# Patient Record
Sex: Female | Born: 1945
Health system: Southern US, Community
[De-identification: ages and names within clinical notes are randomized; demographics above are authoritative.]

## PROBLEM LIST (undated history)

## (undated) DIAGNOSIS — Z9889 Other specified postprocedural states: Secondary | ICD-10-CM

## (undated) DIAGNOSIS — K219 Gastro-esophageal reflux disease without esophagitis: Secondary | ICD-10-CM

## (undated) DIAGNOSIS — M1990 Unspecified osteoarthritis, unspecified site: Secondary | ICD-10-CM

## (undated) DIAGNOSIS — R7303 Prediabetes: Secondary | ICD-10-CM

## (undated) DIAGNOSIS — E7800 Pure hypercholesterolemia, unspecified: Secondary | ICD-10-CM

## (undated) DIAGNOSIS — R112 Nausea with vomiting, unspecified: Secondary | ICD-10-CM

## (undated) DIAGNOSIS — E039 Hypothyroidism, unspecified: Secondary | ICD-10-CM

## (undated) DIAGNOSIS — I10 Essential (primary) hypertension: Secondary | ICD-10-CM

## (undated) HISTORY — PX: TOTAL KNEE ARTHROPLASTY: SHX125

## (undated) HISTORY — PX: CHOLECYSTECTOMY: SHX55

## (undated) HISTORY — PX: URETER SURGERY: SHX823

## (undated) HISTORY — PX: ABDOMINAL HYSTERECTOMY: SHX81

## (undated) HISTORY — PX: DIAGNOSTIC LAPAROSCOPY: SUR761

---

## 1997-05-07 ENCOUNTER — Ambulatory Visit (HOSPITAL_COMMUNITY): Admission: RE | Admit: 1997-05-07 | Discharge: 1997-05-07 | Payer: Self-pay | Admitting: Family Medicine

## 1998-05-20 ENCOUNTER — Encounter: Payer: Self-pay | Admitting: Family Medicine

## 1998-05-20 ENCOUNTER — Ambulatory Visit (HOSPITAL_COMMUNITY): Admission: RE | Admit: 1998-05-20 | Discharge: 1998-05-20 | Payer: Self-pay | Admitting: Family Medicine

## 1999-05-23 ENCOUNTER — Ambulatory Visit (HOSPITAL_COMMUNITY): Admission: RE | Admit: 1999-05-23 | Discharge: 1999-05-23 | Payer: Self-pay | Admitting: Family Medicine

## 1999-05-23 ENCOUNTER — Encounter: Payer: Self-pay | Admitting: Family Medicine

## 2000-05-24 ENCOUNTER — Ambulatory Visit (HOSPITAL_COMMUNITY): Admission: RE | Admit: 2000-05-24 | Discharge: 2000-05-24 | Payer: Self-pay | Admitting: Family Medicine

## 2000-05-24 ENCOUNTER — Encounter: Payer: Self-pay | Admitting: Family Medicine

## 2001-05-29 ENCOUNTER — Ambulatory Visit (HOSPITAL_COMMUNITY): Admission: RE | Admit: 2001-05-29 | Discharge: 2001-05-29 | Payer: Self-pay | Admitting: Family Medicine

## 2001-05-29 ENCOUNTER — Encounter: Payer: Self-pay | Admitting: Family Medicine

## 2002-06-18 ENCOUNTER — Encounter: Payer: Self-pay | Admitting: Family Medicine

## 2002-06-18 ENCOUNTER — Ambulatory Visit (HOSPITAL_COMMUNITY): Admission: RE | Admit: 2002-06-18 | Discharge: 2002-06-18 | Payer: Self-pay | Admitting: Family Medicine

## 2002-06-20 ENCOUNTER — Encounter: Payer: Self-pay | Admitting: Family Medicine

## 2002-06-20 ENCOUNTER — Encounter: Admission: RE | Admit: 2002-06-20 | Discharge: 2002-06-20 | Payer: Self-pay | Admitting: Family Medicine

## 2003-06-25 ENCOUNTER — Ambulatory Visit (HOSPITAL_COMMUNITY): Admission: RE | Admit: 2003-06-25 | Discharge: 2003-06-25 | Payer: Self-pay | Admitting: Family Medicine

## 2004-06-28 ENCOUNTER — Ambulatory Visit (HOSPITAL_COMMUNITY): Admission: RE | Admit: 2004-06-28 | Discharge: 2004-06-28 | Payer: Self-pay | Admitting: Family Medicine

## 2004-11-15 ENCOUNTER — Encounter: Admission: RE | Admit: 2004-11-15 | Discharge: 2004-11-15 | Payer: Self-pay | Admitting: Family Medicine

## 2005-07-07 ENCOUNTER — Ambulatory Visit (HOSPITAL_COMMUNITY): Admission: RE | Admit: 2005-07-07 | Discharge: 2005-07-07 | Payer: Self-pay | Admitting: Family Medicine

## 2005-09-28 ENCOUNTER — Other Ambulatory Visit: Admission: RE | Admit: 2005-09-28 | Discharge: 2005-09-28 | Payer: Self-pay | Admitting: Family Medicine

## 2006-08-10 ENCOUNTER — Ambulatory Visit (HOSPITAL_COMMUNITY): Admission: RE | Admit: 2006-08-10 | Discharge: 2006-08-10 | Payer: Self-pay | Admitting: Family Medicine

## 2007-08-13 ENCOUNTER — Ambulatory Visit (HOSPITAL_COMMUNITY): Admission: RE | Admit: 2007-08-13 | Discharge: 2007-08-13 | Payer: Self-pay | Admitting: Family Medicine

## 2007-08-22 ENCOUNTER — Inpatient Hospital Stay (HOSPITAL_COMMUNITY): Admission: RE | Admit: 2007-08-22 | Discharge: 2007-08-25 | Payer: Self-pay | Admitting: Orthopedic Surgery

## 2008-08-14 ENCOUNTER — Ambulatory Visit (HOSPITAL_COMMUNITY): Admission: RE | Admit: 2008-08-14 | Discharge: 2008-08-14 | Payer: Self-pay | Admitting: Family Medicine

## 2008-10-08 ENCOUNTER — Other Ambulatory Visit: Admission: RE | Admit: 2008-10-08 | Discharge: 2008-10-08 | Payer: Self-pay | Admitting: Family Medicine

## 2009-08-16 ENCOUNTER — Encounter: Admission: RE | Admit: 2009-08-16 | Discharge: 2009-08-16 | Payer: Self-pay | Admitting: Family Medicine

## 2010-07-12 NOTE — Op Note (Signed)
Krystal Kent, Krystal Kent                 ACCOUNT NO.:  [id number]   MEDICAL RECORD NO.:  [id number]          PATIENT TYPE:  INP   LOCATION:  5011                         FACILITY:  [facility]   PHYSICIAN:  Vania Rea. Supple, M.D.  DATE OF BIRTH:  06-19-1945   DATE OF PROCEDURE:  08/22/2007  DATE OF DISCHARGE:                               OPERATIVE REPORT   PREOPERATIVE DIAGNOSIS:  End-stage left knee osteoarthrosis.   POSTOPERATIVE DIAGNOSIS:  End-stage left knee osteoarthrosis.   PROCEDURE:  Left total knee arthroplasty using a cemented DePuy Sigma  implant size 2.5 femur, size 3 tibial tray, 35-mm patellar button, and a  15-mm thick rotating platform polyethylene insert.   SURGEON:  Vania Rea. Supple, MD   ASSISTANT:  Lucita Lora. Shuford, PA-C   ANESTHESIA:  General endotracheal as well as a preop femoral nerve  block.   TOURNIQUET TIME:  44 minutes.   ESTIMATED BLOOD LOSS:  Minimal.   DRAINS:  Hemovac x1.   HISTORY:  Krystal Kent is a 65 year old female who has had chronic and  progressively increasing bilateral knee pain with clinical and  radiographic evidence for end-stage osteoarthrosis.  Due to her ongoing  pain and function limitation, she is brought to the operating room at  this time for planned left total knee arthroplasty.   Preoperatively counseled Krystal Kent on treatment options as well as risks  versus benefits.  Possible surgical complications bleeding, infection,  neurovascular injury, DVT, PE, persistent pain, loss of motion, and  possible need for revision surgery reviewed.  She understands and  accepts and agrees with our planned procedure.   PROCEDURE IN DETAIL:  After undergoing routine preop evaluation, the  patient received prophylactic antibiotics.  Femoral nerve block  established in the holding area by the anesthesia department.  Placed  supine on the operative table and underwent smooth induction of general  endotracheal anesthesia.  Foley catheter was placed.   Left thigh and  left leg was sterilely prepped and draped in standard fashion.  Time-out  called.  Leg was exsanguinated.  Tourniquet inflated to 350 mmHg.  Anterior midline incision was made approximately 20 cm in length  centered over the patella.  Skin flaps were mobilized.  Medial  parapatellar patellar arthrotomy was performed.  Patella was everted,  knee flexed up, and the cruciate ligaments were divided.  Fat pad was  excised and drill was then used to gain access to the intramedullary  canal of the distal femur and intramedullary guide was then passed.  We  made a 11-mm resection off the distal, femur 5 degrees valgus cut with  an oscillating saw.  We then measured the distal femur and then size 2.5  had the best fit.  The 2.5 femoral cutting guide was then applied.  We  made the anterior, posterior, and chamfer cuts on the distal femur.  This showed excellent fit with the trial prosthesis.  We then turned our  attention to the proximal tibia.  The remnants of the menisci and  cruciate ligaments were then removed.  The proximal tibia was exposed  and  using an extramedullary guide, we then made a 10-mm resection  measured from the lateral tibial plateau with proper posterior slope.  Proximal tibia was then measured and sized.  It had best coverage.  The  size 3 trial was pinned into position and then we performed a trial  reduction with the implants and this showed excellent soft tissue  balance, good knee stability, and mobility.  We then completed terminal  preparation of the proximal tibia with the reamer and broach.  Attention  was turned to the distal femur where we made the distal femoral box cut  using the box cutting guide.  We removed residual soft tissue from the  posterior aspect of the joint.  Attention was then turned to the  patella, which was cut with an oscillating saw removing approximately 8  mm of bone and then the stabilizing lug holes were drilled.  We   performed trial implant reductions with a box guide and again there was  full knee of motion, full extension, and good soft tissue balance.  Final implants were removed.  The joint was then pulsatilely lavaged and  meticulously cleaned.  Cement was mixed at the back table and at the  appropriate consistency all implants were cemented in position beginning  with the tibia, then the femur, then the patella.  All residual cement  was then meticulously removed.  Once the cement had hardened, we then  performed trial reductions and the size 15 tibial implant had best soft  tissue balance.  The final 50-mm poly was inserted after we meticulously  inspected the joint.  There was excellent knee motion with good  stability.  A Hemovac was then brought out superolaterally.  The  tourniquet was let down.  Hemostasis was obtained.  The parapatellar  arthrotomy was closed with series of figure-of-eight #1 Vicryl sutures,  2-0 Vicryl were used for the subcu, and intracuticular 3-0 Monocryl for  the skin followed by Steri-Strips.  A bulky dry dressing was then  wrapped around the knee and legs were wrapped with an Ace bandage from  foot to thigh.  It should be mentioned that she did get an additional  gram of Ancef when the tourniquet was let down.  The patient was then  awakened, extubated, and taken to the recovery room in stable condition.      Vania Rea. Supple, M.D.  Electronically Signed     KMS/MEDQ  D:  08/22/2007  T:  08/23/2007  Job:  [id number]

## 2010-07-15 NOTE — Discharge Summary (Signed)
Krystal Kent, Krystal Kent                 ACCOUNT NO.:  [id number]   MEDICAL RECORD NO.:  [id number]          PATIENT TYPE:  INP   LOCATION:  5011                         FACILITY:  [facility]   PHYSICIAN:  Vania Rea. Supple, M.D.  DATE OF BIRTH:  January 31, 1946   DATE OF ADMISSION:  08/22/2007  DATE OF DISCHARGE:  08/25/2007                               DISCHARGE SUMMARY   ADMISSION DIAGNOSES:  1. End-stage osteoarthrosis of left knee.  2. Hypertension.  3. Hyperthyroidism.  4. History of postoperative nausea and vomiting.   DISCHARGE DIAGNOSES:  1. End-stage osteoarthrosis of left knee.  2. Hypertension.  3. Hyperthyroidism.  4. History of postoperative nausea and vomiting.  5. Status post left total knee arthroplasty.   OPERATION:  Left total knee arthroplasty under general anesthetic with a  femoral nerve block.   SURGEON:  Vania Rea. Supple, MD   ASSISTANT:  Lucita Lora. Shuford, PA-C   BRIEF HISTORY:  [name] is a 65 year old female who is followed by [place]  for end-stage osteoarthrosis to bilateral knees.  Her left is currently  most symptomatic.  She is having ongoing pain as well as functional  limitations and has had failure to improve with conservative measures.  At this time, total knee arthroplasty was indicated.  Risks and benefits  were discussed with the patient and she wished to proceed.   HOSPITAL COURSE:  The patient admitted, underwent the above-named  procedure, and tolerated this well.  All appropriate IV antibiotics and  analgesics were utilized.  Postoperatively, she was placed on DVT and PE  prophylaxis, and weightbearing as tolerated with CPM unit and formal  physical therapy.  She did extremely well, was able to be weaned off IV  medications to p.o.'s.  Physical therapy continued work with the  patient, and she had met all of her discharge goals by postop day #3.  On day #3, she was afebrile.  Vital signs were stable.  Hemoglobin was  stable at 9.6.  Her incision was  clean and dry, and discharge plans were  made to discharge to home, to follow up as an outpatient, and for home  health OT and PT.   EKG with normal sinus rhythm.   LABORATORY DATA:  Hemoglobin of 14.6 preoperatively and postoperatively  11.2, 9.8, and 9.6.  Chemistries on admission within normal limits.  She  may have mild hypokalemia of 3.3, but resolved to 3.8 on date of  discharge.  No x-rays were found in the chart at the time of dictation.   CONDITION ON DISCHARGE:  Stable, improved.   DISCHARGE MEDICATIONS AND PLANS:  The patient had been discharged to  home.  She will follow up in 2 weeks, call beforetime.  May shower 5  days postoperatively.  Prescriptions for Percocet, Coumadin, Ambien, and  Robaxin are provided.  [facility] and OT is arranged.  Resume her home meds and home diet.      Tracy A. Shuford, P.A.-C.       Vania Rea. Supple, M.D.  Electronically Signed    TAS/MEDQ  D:  09/26/2007  T:  09/27/2007  Job:  [id number]

## 2010-07-19 ENCOUNTER — Other Ambulatory Visit: Payer: Self-pay | Admitting: Family Medicine

## 2010-07-19 DIAGNOSIS — Z1231 Encounter for screening mammogram for malignant neoplasm of breast: Secondary | ICD-10-CM

## 2010-08-24 ENCOUNTER — Ambulatory Visit
Admission: RE | Admit: 2010-08-24 | Discharge: 2010-08-24 | Disposition: A | Discharge status: Home / Self Care | Payer: Commercial Indemnity | Source: Ambulatory Visit | Attending: Family Medicine | Admitting: Family Medicine

## 2010-08-24 DIAGNOSIS — Z1231 Encounter for screening mammogram for malignant neoplasm of breast: Secondary | ICD-10-CM

## 2010-11-24 LAB — CBC
HCT: 27.8 — ABNORMAL LOW
HCT: 27.9 — ABNORMAL LOW
HCT: 32.3 — ABNORMAL LOW
HCT: 41.3
Hemoglobin: 14.6
Hemoglobin: 9.8 — ABNORMAL LOW
MCHC: 35.3
Platelets: 133 — ABNORMAL LOW
Platelets: 167
Platelets: 228
RBC: 3 — ABNORMAL LOW
RBC: 4.45
RDW: 12.6
WBC: 4.6
WBC: 7.3
WBC: 7.8
WBC: 9.6

## 2010-11-24 LAB — BASIC METABOLIC PANEL
BUN: 10
CO2: 27
CO2: 28
CO2: 29
CO2: 29
Calcium: 10
Calcium: 8.2 — ABNORMAL LOW
Chloride: 100
Chloride: 103
Chloride: 107
Creatinine, Ser: 0.73
GFR calc Af Amer: >60
GFR calc Af Amer: >60
GFR calc non Af Amer: >60
GFR calc non Af Amer: >60
GFR calc non Af Amer: >60
Glucose, Bld: 184 — ABNORMAL HIGH
Potassium: 3.3 — ABNORMAL LOW
Potassium: 3.8
Sodium: 139

## 2010-11-24 LAB — PROTIME-INR
INR: 0.9
Prothrombin Time: 12.8

## 2011-07-17 ENCOUNTER — Other Ambulatory Visit: Payer: Self-pay | Admitting: Family Medicine

## 2011-07-17 DIAGNOSIS — Z1231 Encounter for screening mammogram for malignant neoplasm of breast: Secondary | ICD-10-CM

## 2011-09-05 ENCOUNTER — Ambulatory Visit
Admission: RE | Admit: 2011-09-05 | Discharge: 2011-09-05 | Disposition: A | Discharge status: Home / Self Care | Payer: Medicare Other | Source: Ambulatory Visit | Attending: Family Medicine | Admitting: Family Medicine

## 2011-09-05 DIAGNOSIS — Z1231 Encounter for screening mammogram for malignant neoplasm of breast: Secondary | ICD-10-CM

## 2012-08-05 ENCOUNTER — Other Ambulatory Visit: Payer: Self-pay

## 2012-08-05 DIAGNOSIS — Z1231 Encounter for screening mammogram for malignant neoplasm of breast: Secondary | ICD-10-CM

## 2012-09-03 ENCOUNTER — Encounter (HOSPITAL_COMMUNITY): Payer: Self-pay | Admitting: Emergency Medicine

## 2012-09-03 ENCOUNTER — Emergency Department (HOSPITAL_COMMUNITY)
Admission: EM | Admit: 2012-09-03 | Discharge: 2012-09-03 | Disposition: A | Discharge status: Home / Self Care | Payer: Medicare Other | Attending: Emergency Medicine | Admitting: Emergency Medicine

## 2012-09-03 ENCOUNTER — Emergency Department (HOSPITAL_COMMUNITY): Payer: Medicare Other

## 2012-09-03 DIAGNOSIS — E785 Hyperlipidemia, unspecified: Secondary | ICD-10-CM

## 2012-09-03 DIAGNOSIS — Z7982 Long term (current) use of aspirin: Secondary | ICD-10-CM | POA: Insufficient documentation

## 2012-09-03 DIAGNOSIS — Z79899 Other long term (current) drug therapy: Secondary | ICD-10-CM | POA: Insufficient documentation

## 2012-09-03 DIAGNOSIS — R0789 Other chest pain: Secondary | ICD-10-CM | POA: Insufficient documentation

## 2012-09-03 DIAGNOSIS — R079 Chest pain, unspecified: Secondary | ICD-10-CM

## 2012-09-03 DIAGNOSIS — E7800 Pure hypercholesterolemia, unspecified: Secondary | ICD-10-CM | POA: Insufficient documentation

## 2012-09-03 DIAGNOSIS — I10 Essential (primary) hypertension: Secondary | ICD-10-CM | POA: Insufficient documentation

## 2012-09-03 HISTORY — DX: Pure hypercholesterolemia, unspecified: E78.00

## 2012-09-03 HISTORY — DX: Essential (primary) hypertension: I10

## 2012-09-03 LAB — URINALYSIS, ROUTINE W REFLEX MICROSCOPIC
Bilirubin Urine: NEGATIVE
Hgb urine dipstick: NEGATIVE
Ketones, ur: NEGATIVE mg/dL
Nitrite: NEGATIVE
Protein, ur: NEGATIVE mg/dL
Urobilinogen, UA: 0.2 mg/dL (ref 0.0–1.0)

## 2012-09-03 LAB — CBC
HCT: 39.1 % (ref 36.0–46.0)
Hemoglobin: 13.3 g/dL (ref 12.0–15.0)
MCH: 30.7 pg (ref 26.0–34.0)
MCHC: 34 g/dL (ref 30.0–36.0)
RDW: 13 % (ref 11.5–15.5)

## 2012-09-03 LAB — HEPATIC FUNCTION PANEL
ALT: 26 U/L (ref 0–35)
AST: 26 U/L (ref 0–37)
Alkaline Phosphatase: 44 U/L (ref 39–117)
Bilirubin, Direct: <0.1 mg/dL (ref 0.0–0.3)
Total Bilirubin: 0.5 mg/dL (ref 0.3–1.2)

## 2012-09-03 LAB — BASIC METABOLIC PANEL
BUN: 25 mg/dL — ABNORMAL HIGH (ref 6–23)
Calcium: 9.9 mg/dL (ref 8.4–10.5)
Creatinine, Ser: 0.98 mg/dL (ref 0.50–1.10)
GFR calc Af Amer: 68 mL/min — ABNORMAL LOW (ref >90)
GFR calc non Af Amer: 59 mL/min — ABNORMAL LOW (ref >90)
Glucose, Bld: 141 mg/dL — ABNORMAL HIGH (ref 70–99)
Potassium: 3.6 mEq/L (ref 3.5–5.1)

## 2012-09-03 LAB — POCT I-STAT TROPONIN I

## 2012-09-03 MED ORDER — IOHEXOL 350 MG/ML SOLN
100.0000 mL | Freq: Once | INTRAVENOUS | Status: AC | PRN
  Administered 2012-09-03: 100 mL via INTRAVENOUS

## 2012-09-03 MED ORDER — NAPROXEN 375 MG PO TABS: 375.0000 mg | ORAL_TABLET | Freq: Two times a day (BID) | ORAL | Status: DC

## 2012-09-03 NOTE — ED Notes (Signed)
IV team paged to start 20 Gauge for angio chest CT.

## 2012-09-03 NOTE — ED Provider Notes (Signed)
History    CSN: [id number] Arrival date & time 09/03/12  1626  First MD Initiated Contact with Patient 09/03/12 1629     Chief Complaint  Patient presents with  . Chest Pain   (Consider location/radiation/quality/duration/timing/severity/associated sxs/prior Treatment) The history is provided by the patient and the spouse.  Krystal Kent is a 67 y/o F with PMHx of HLD, HTN presenting to the ED, with husband, with left sided chest pain that started at approximately 2:30PM this afternoon and has been ongoing throughout most of the day, patient is unable to described what the pain feels like, but stated that there is pain localized to the left side of the chest, underneath her left breast without radiation to the left arm or back. Patient reported that the pain worsens with deep inhalation, reported that she took a Motrin at 3:00PM and two ASA 81 mg, and one tums. Stated that she was exercising on her bike for her knee and was vacuuming today before the pain started. Patient reported that she was experiencing left arm discomfort approximately 3-5 days ago, unable to described the discomfort, stated that she was unable to get comfortable. Patient concerned of blood clot because she stated that she went to [place] during Father's day weekend, was in the car for a long time and someone told her that if could possibly be a clot. Patient stated that she had a stress test approximately 10-12 years ago. Denied numbness, tingling, fall, injury, history of cardiac issues, difficulty breathing, dyspnea, orthopnea, headache, dizziness, neck pain, back pain, weakness, fatigue. PCP: Dr. Ihor Dow   Past Medical History  Diagnosis Date  . Hypertension   . Hypercholesteremia    Past Surgical History  Procedure Laterality Date  . Abdominal hysterectomy    . Cholecystectomy    . Total knee arthroplasty     No family history on file. History  Substance Use Topics  . Smoking status: Never Smoker   . Smokeless  tobacco: Not on file  . Alcohol Use: No   OB History   Grav Para Term Preterm Abortions TAB SAB Ect Mult Living                 Review of Systems  Constitutional: Negative for fever and chills.  HENT: Negative for trouble swallowing and neck pain.   Eyes: Negative for pain and visual disturbance.  Respiratory: Negative for cough, chest tightness and shortness of breath.   Cardiovascular: Positive for chest pain. Negative for leg swelling.  Gastrointestinal: Negative for nausea, vomiting and diarrhea.  Genitourinary: Negative for dysuria, decreased urine volume and difficulty urinating.  Musculoskeletal: Negative for back pain.  Neurological: Negative for dizziness, weakness, light-headedness and headaches.  All other systems reviewed and are negative.    Allergies  Demerol  Home Medications   Current Outpatient Rx  Name  Route  Sig  Dispense  Refill  . amLODipine (NORVASC) 10 MG tablet   Oral   Take 10 mg by mouth daily.         Marland Kitchen aspirin 81 MG chewable tablet   Oral   Chew 81 mg by mouth daily.         Marland Kitchen atorvastatin (LIPITOR) 20 MG tablet   Oral   Take 20 mg by mouth daily.         . calcium carbonate (OS-CAL) 600 MG TABS   Oral   Take 600 mg by mouth daily with breakfast.         . cholecalciferol (  VITAMIN D) 1000 UNITS tablet   Oral   Take 1,000 Units by mouth daily.         . Coenzyme Q10 (CO Q 10 PO)   Oral   Take 1 capsule by mouth daily.         . fenofibrate (TRICOR) 48 MG tablet   Oral   Take 48 mg by mouth daily.         Marland Kitchen glucosamine-chondroitin 500-400 MG tablet   Oral   Take 1 tablet by mouth daily.         Marland Kitchen levothyroxine (SYNTHROID, LEVOTHROID) 75 MCG tablet   Oral   Take 75 mcg by mouth daily before breakfast.         . Multiple Vitamins-Minerals (MULTIVITAMIN WITH MINERALS) tablet   Oral   Take 1 tablet by mouth daily.         Marland Kitchen triamterene-hydrochlorothiazide (MAXZIDE-25) 37.5-25 MG per tablet   Oral   Take  1 tablet by mouth daily.          BP 153/61  Pulse 85  Temp(Src) 98.6 F (37 C) (Oral)  Resp 17  Wt 168 lb (76.204 kg)  SpO2 96% Physical Exam  Nursing note and vitals reviewed. Constitutional: She is oriented to person, place, and time. She appears well-developed and well-nourished. No distress.  HENT:  Head: Normocephalic and atraumatic.  Eyes: Conjunctivae and EOM are normal. Pupils are equal, round, and reactive to light.  Neck: Normal range of motion. Neck supple. No tracheal deviation present.  Cardiovascular: Normal rate, regular rhythm and normal heart sounds.  Exam reveals no friction rub.   No murmur heard. Pulses:      Radial pulses are 2+ on the right side, and 2+ on the left side.       Dorsalis pedis pulses are 2+ on the right side, and 2+ on the left side.  Negative ankle and foot swelling noted Negative pitting edema Cap refill < 3 seconds  Pulmonary/Chest: Effort normal and breath sounds normal. No respiratory distress. She has no wheezes. She has no rales.    Pain reproducible with palpation to the left side of the chest underneath breast, approximately left ribs 5-6 mid-clavicular region  When passive pulling of left arm back patient reported feeling a pulling sensation with this motion  Musculoskeletal: Normal range of motion.  Strength 5+/5+ with resistance  Lymphadenopathy:    She has no cervical adenopathy.  Neurological: She is alert and oriented to person, place, and time. She exhibits normal muscle tone. Coordination normal.  Skin: Skin is warm and dry. No rash noted. She is not diaphoretic. No erythema.  Psychiatric: She has a normal mood and affect. Her behavior is normal. Thought content normal.    ED Course  Procedures (including critical care time)  8:00PM Discussed with patient CT angiogram to be performed to rule out blood clot. Denied chest discomfort.    Date: 09/03/2012  Rate: 88  Rhythm: normal sinus rhythm  QRS Axis: normal   Intervals: normal  ST/T Wave abnormalities: normal  Conduction Disutrbances:none  Narrative Interpretation:   Old EKG Reviewed: unchanged   Labs Reviewed  BASIC METABOLIC PANEL - Abnormal; Notable for the following:    Glucose, Bld 141 (*)    BUN 25 (*)    GFR calc non Af Amer 59 (*)    GFR calc Af Amer 68 (*)    All other components within normal limits  CBC  URINALYSIS, ROUTINE W REFLEX MICROSCOPIC  HEPATIC FUNCTION PANEL  POCT I-STAT TROPONIN I  POCT I-STAT TROPONIN I   Dg Chest 2 View  09/03/2012   *RADIOLOGY REPORT*  Clinical Data: Left chest pain  CHEST - 2 VIEW  Comparison: 08/19/2007  Findings: Cardiomediastinal silhouette is stable.  No acute infiltrate or pleural effusion.  No pulmonary edema.  Bony thorax is stable.  IMPRESSION: No active disease.  No significant change.   Original Report Authenticated By: Natasha Mead, M.D.   Ct Angio Chest Pe W/cm &/or Wo Cm  09/03/2012   *RADIOLOGY REPORT*  Clinical Data: Chest pain, possible pulmonary embolus, left-sided pain  CT ANGIOGRAPHY CHEST  Technique:  Multidetector CT imaging of the chest using the standard protocol during bolus administration of intravenous contrast. Multiplanar reconstructed images including MIPs were obtained and reviewed to evaluate the vascular anatomy.  Contrast: OMNIPAQUE IOHEXOL 350 MG/ML SOLN  Comparison: None.  Findings: Images of the thoracic inlet are unremarkable.  No pulmonary embolus is noted.  No mediastinal hematoma or adenopathy. Heart size is within normal limits.  Small hiatal hernia.  No axillary adenopathy.  Visualized upper abdomen is unremarkable.  Images of the lung parenchyma shows no acute infiltrate or pleural effusion.  No pulmonary edema.  Sagittal images of the spine shows osteopenia and mild degenerative changes thoracic spine.  Sagittal view of the sternum is unremarkable.  IMPRESSION:  1.  No pulmonary embolus is noted. 2.  No acute infiltrate or pulmonary edema. 3.  Small hiatal  hernia.   Original Report Authenticated By: Natasha Mead, M.D.   1. Chest pain   2. HTN (hypertension)   3. HLD (hyperlipidemia)     MDM  Patient presenting to the ED with chest pain localized to underneath the left breast without radiation to the left arm that has been ongoing starting today at 2:30PM - patient unable to describe pain. Pain reproducible upon palpation to underneath left breast, near left ribs 5-6 mid-clavicular region. EKG negative for ischemic changes. First and second set of troponins negative. CBC negative findings. BMP elevated BUN noted (25), Cr negative elevation. UA negative findings. Chest xray negative findings. Hepatic function panel negative findings. Dr. Brandon Melnick recommended CT angio to rule out PE - CT angio performed negative PE noted - small hiatal hernia noted, patient aware of this before. Patient reported not having any discomfort - denied chest pain at this moment. Discussed case with Dr. Brandon Melnick - cleared patient for discharge. Patient stable, afebrile. Discharged patient. Referred to PCP and cardiology - recommended patient to get stress test to be performed. Discussed with patient to rest and stay hydrated, monitor chest pain occurences and keep track. Discussed with patient to continue to monitor symptoms and if symptoms are to worsen or change to report back to the ED - strict return instructions given. Patient agreed to plan of care, understood, all questions answered.                     [employer], PA-C 09/04/12 [phone]

## 2012-09-03 NOTE — ED Notes (Signed)
Patient with new onset left sided chest pain under left breast.  No other associated symptoms and no radiation of the pain.  Pain started suddenly and has been constant since 1430 today rated as a 7/10.  Patient took one Tum and two baby aspirin and one Motrin with no relief.  Patient has history of hypertension and high cholesterol.

## 2012-09-03 NOTE — Progress Notes (Signed)
Patient reportds her pcp is Dr. Ihor Dow of [facility] on [address].

## 2012-09-04 NOTE — ED Provider Notes (Signed)
Medical screening examination/treatment/procedure(s) were performed by non-physician practitioner and as supervising physician I was immediately available for consultation/collaboration.  Geoffery Lyons, MD 09/04/12 1800

## 2012-10-01 ENCOUNTER — Ambulatory Visit
Admission: RE | Admit: 2012-10-01 | Discharge: 2012-10-01 | Disposition: A | Discharge status: Home / Self Care | Payer: Medicare Other | Source: Ambulatory Visit

## 2012-10-01 DIAGNOSIS — Z1231 Encounter for screening mammogram for malignant neoplasm of breast: Secondary | ICD-10-CM

## 2012-11-07 ENCOUNTER — Institutional Professional Consult (permissible substitution): Payer: Medicare Other | Admitting: Cardiology

## 2013-09-02 ENCOUNTER — Other Ambulatory Visit: Payer: Self-pay

## 2013-09-02 DIAGNOSIS — Z1231 Encounter for screening mammogram for malignant neoplasm of breast: Secondary | ICD-10-CM

## 2013-10-06 ENCOUNTER — Ambulatory Visit: Payer: Medicare Other

## 2013-10-07 ENCOUNTER — Ambulatory Visit
Admission: RE | Admit: 2013-10-07 | Discharge: 2013-10-07 | Disposition: A | Discharge status: Home / Self Care | Payer: Medicare Other | Source: Ambulatory Visit

## 2013-10-07 DIAGNOSIS — Z1231 Encounter for screening mammogram for malignant neoplasm of breast: Secondary | ICD-10-CM

## 2014-06-01 IMAGING — CT CT ANGIO CHEST
1 of 2 series · 19 of 32 positions shown · IV contrast (OMNIPAQUE 300)
Comparison: None.

CLINICAL DATA: Chest pain, possible pulmonary embolus, left-sided
pain

CT ANGIOGRAPHY CHEST
TECHNIQUE: Multidetector CT imaging of the chest using the
standard protocol during bolus administration of intravenous
contrast. Multiplanar reconstructed images including MIPs were
obtained and reviewed to evaluate the vascular anatomy.
Contrast: 100mL OMNIPAQUE IOHEXOL 350 MG/ML SOLN

[Series 10: thins for pacs · axial · 0.74mm/px · z∈[+22,+204]mm · 19 of 204 slices shown]
[im 11/204  lung]
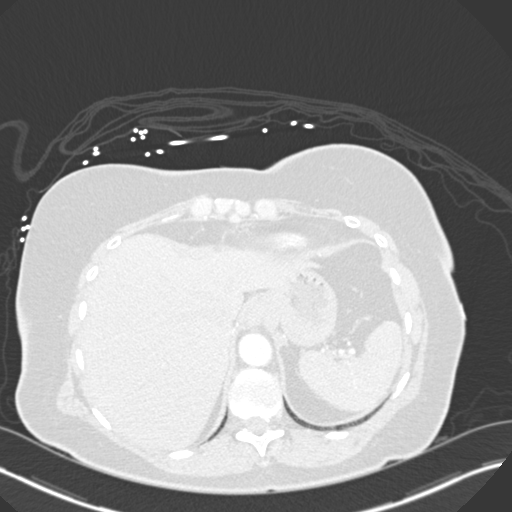
[im 21/204  mediastinal]
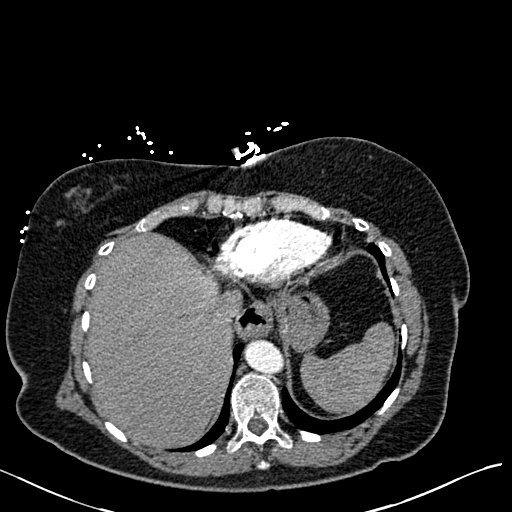
[im 31/204  lung]
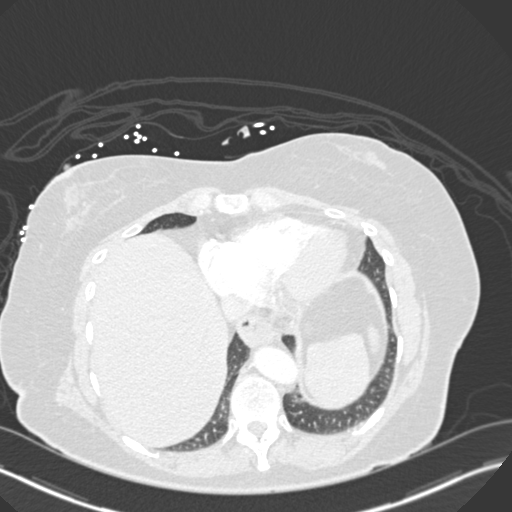
[im 51/204  mediastinal]
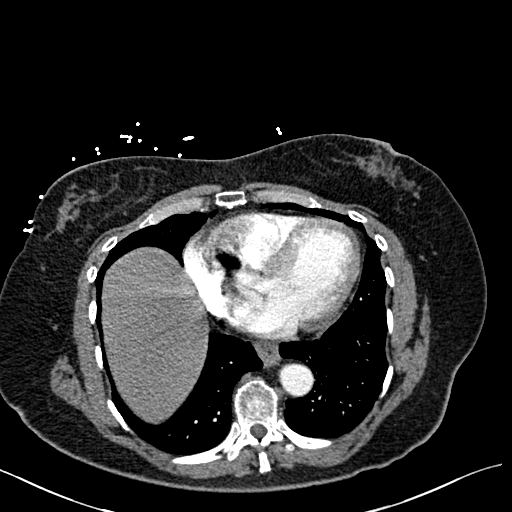
[im 61/204  lung]
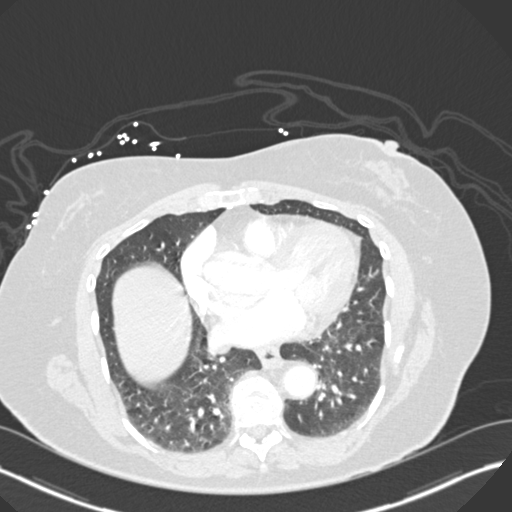
[im 68/204  mediastinal]
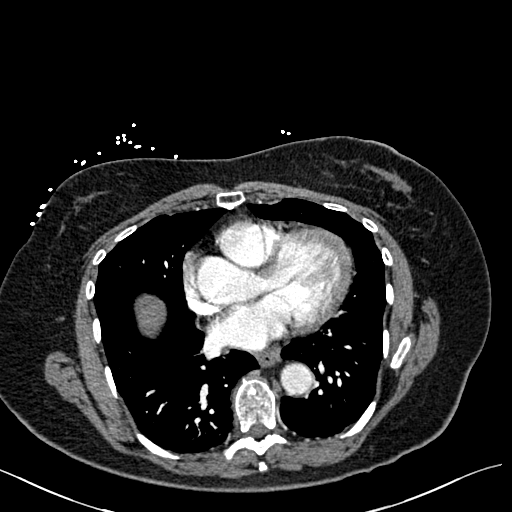
[im 72/204  lung]
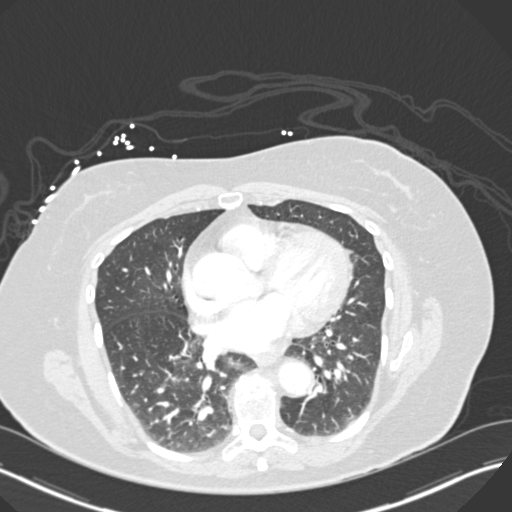
[im 82/204  mediastinal]
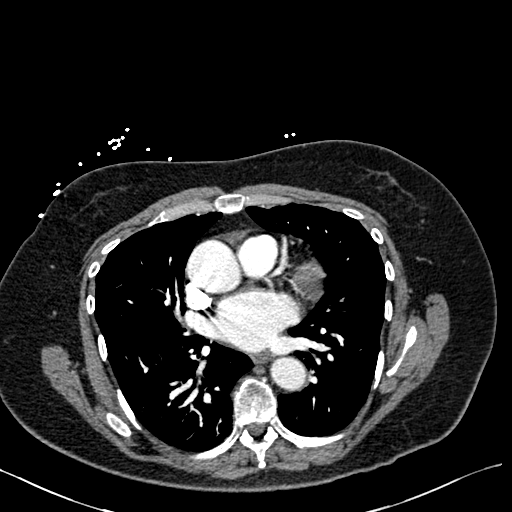
[im 92/204  lung]
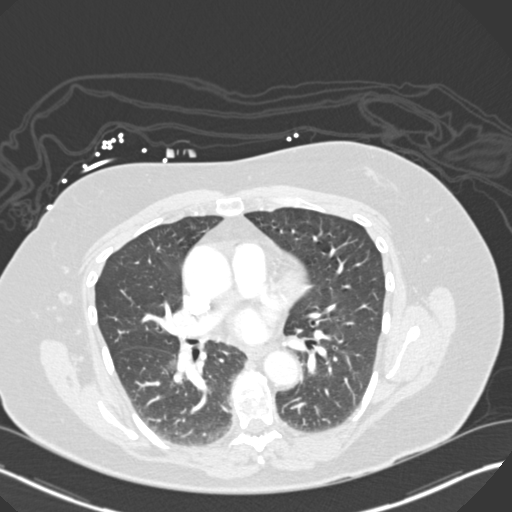
[im 102/204  mediastinal]
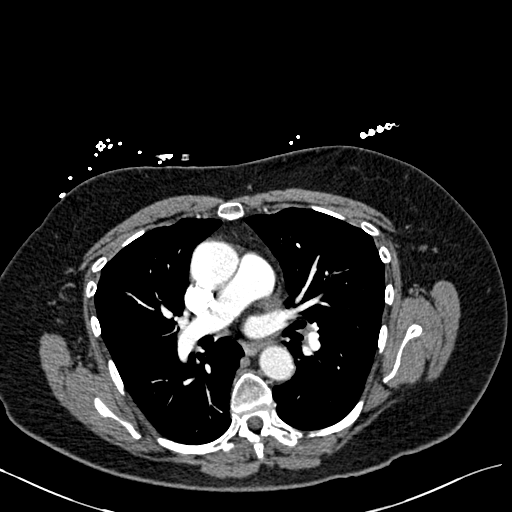
[im 112/204  lung]
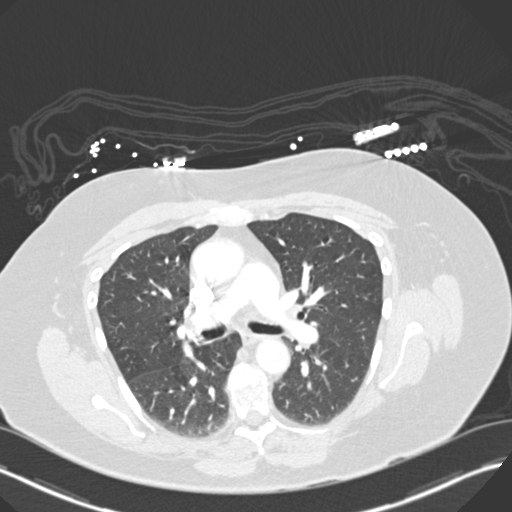
[im 122/204  mediastinal]
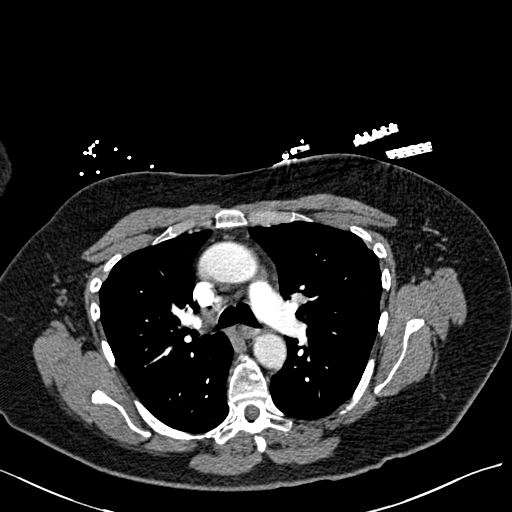
[im 132/204  lung]
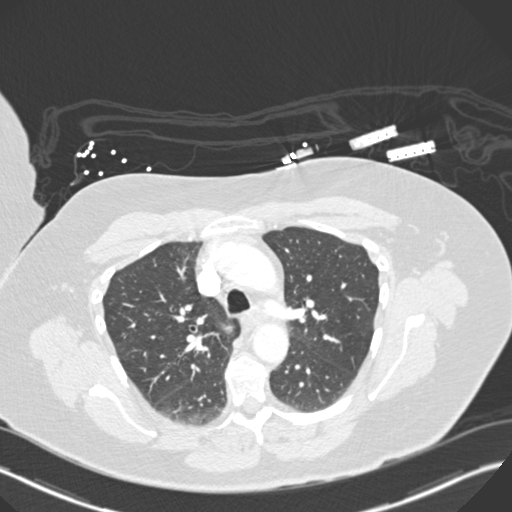
[im 136/204  mediastinal]
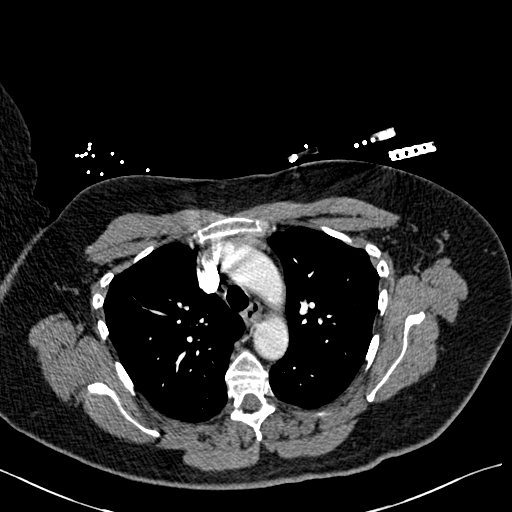
[im 143/204  lung]
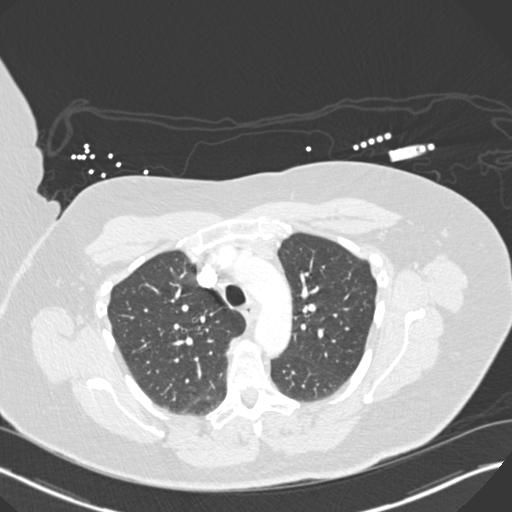
[im 153/204  mediastinal]
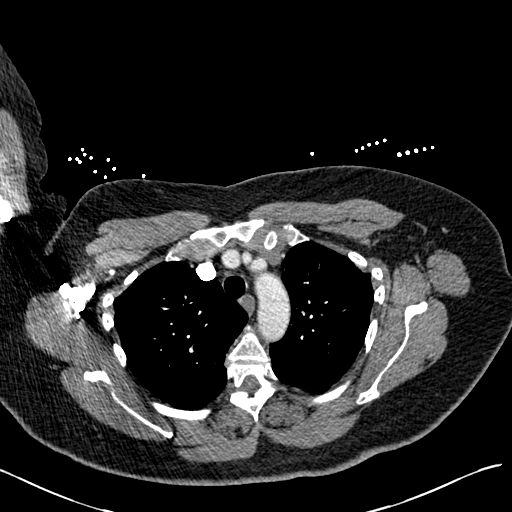
[im 173/204  lung]
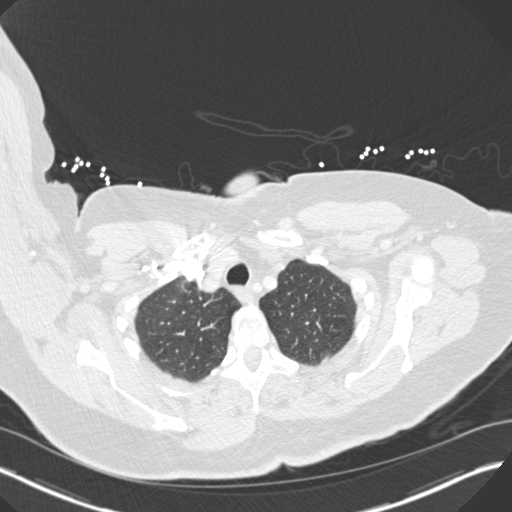
[im 183/204  mediastinal]
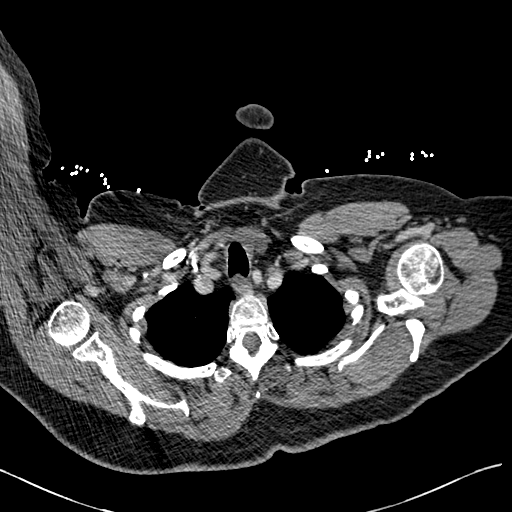
[im 193/204  lung]
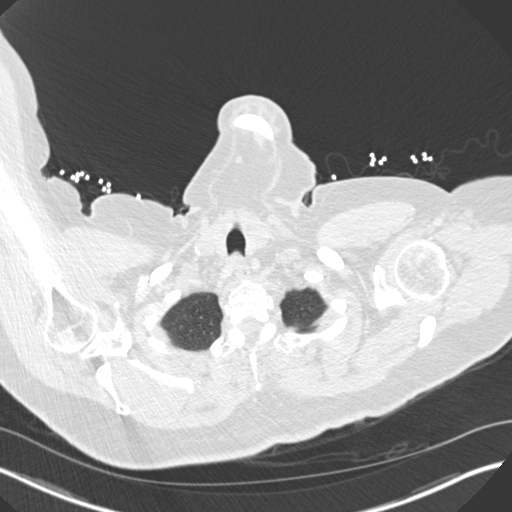

[19 of 32 positions shown; findings below may reference images not displayed]

FINDINGS: Images of the thoracic inlet are unremarkable.  No
pulmonary embolus is noted.  No mediastinal hematoma or adenopathy.
Heart size is within normal limits.  Small hiatal hernia.

No axillary adenopathy.  Visualized upper abdomen is unremarkable.

Images of the lung parenchyma shows no acute infiltrate or pleural
effusion.  No pulmonary edema.

Sagittal images of the spine shows osteopenia and mild degenerative
changes thoracic spine.  Sagittal view of the sternum is
unremarkable.
IMPRESSION: 1.  No pulmonary embolus is noted.
2.  No acute infiltrate or pulmonary edema.
3.  Small hiatal hernia.

## 2014-06-01 IMAGING — CR DG CHEST 2V
2 series · 2 of 2 positions shown · non-contrast
Comparison: 08/19/2007

CLINICAL DATA: Left chest pain

CHEST - 2 VIEW

[w chest pa]
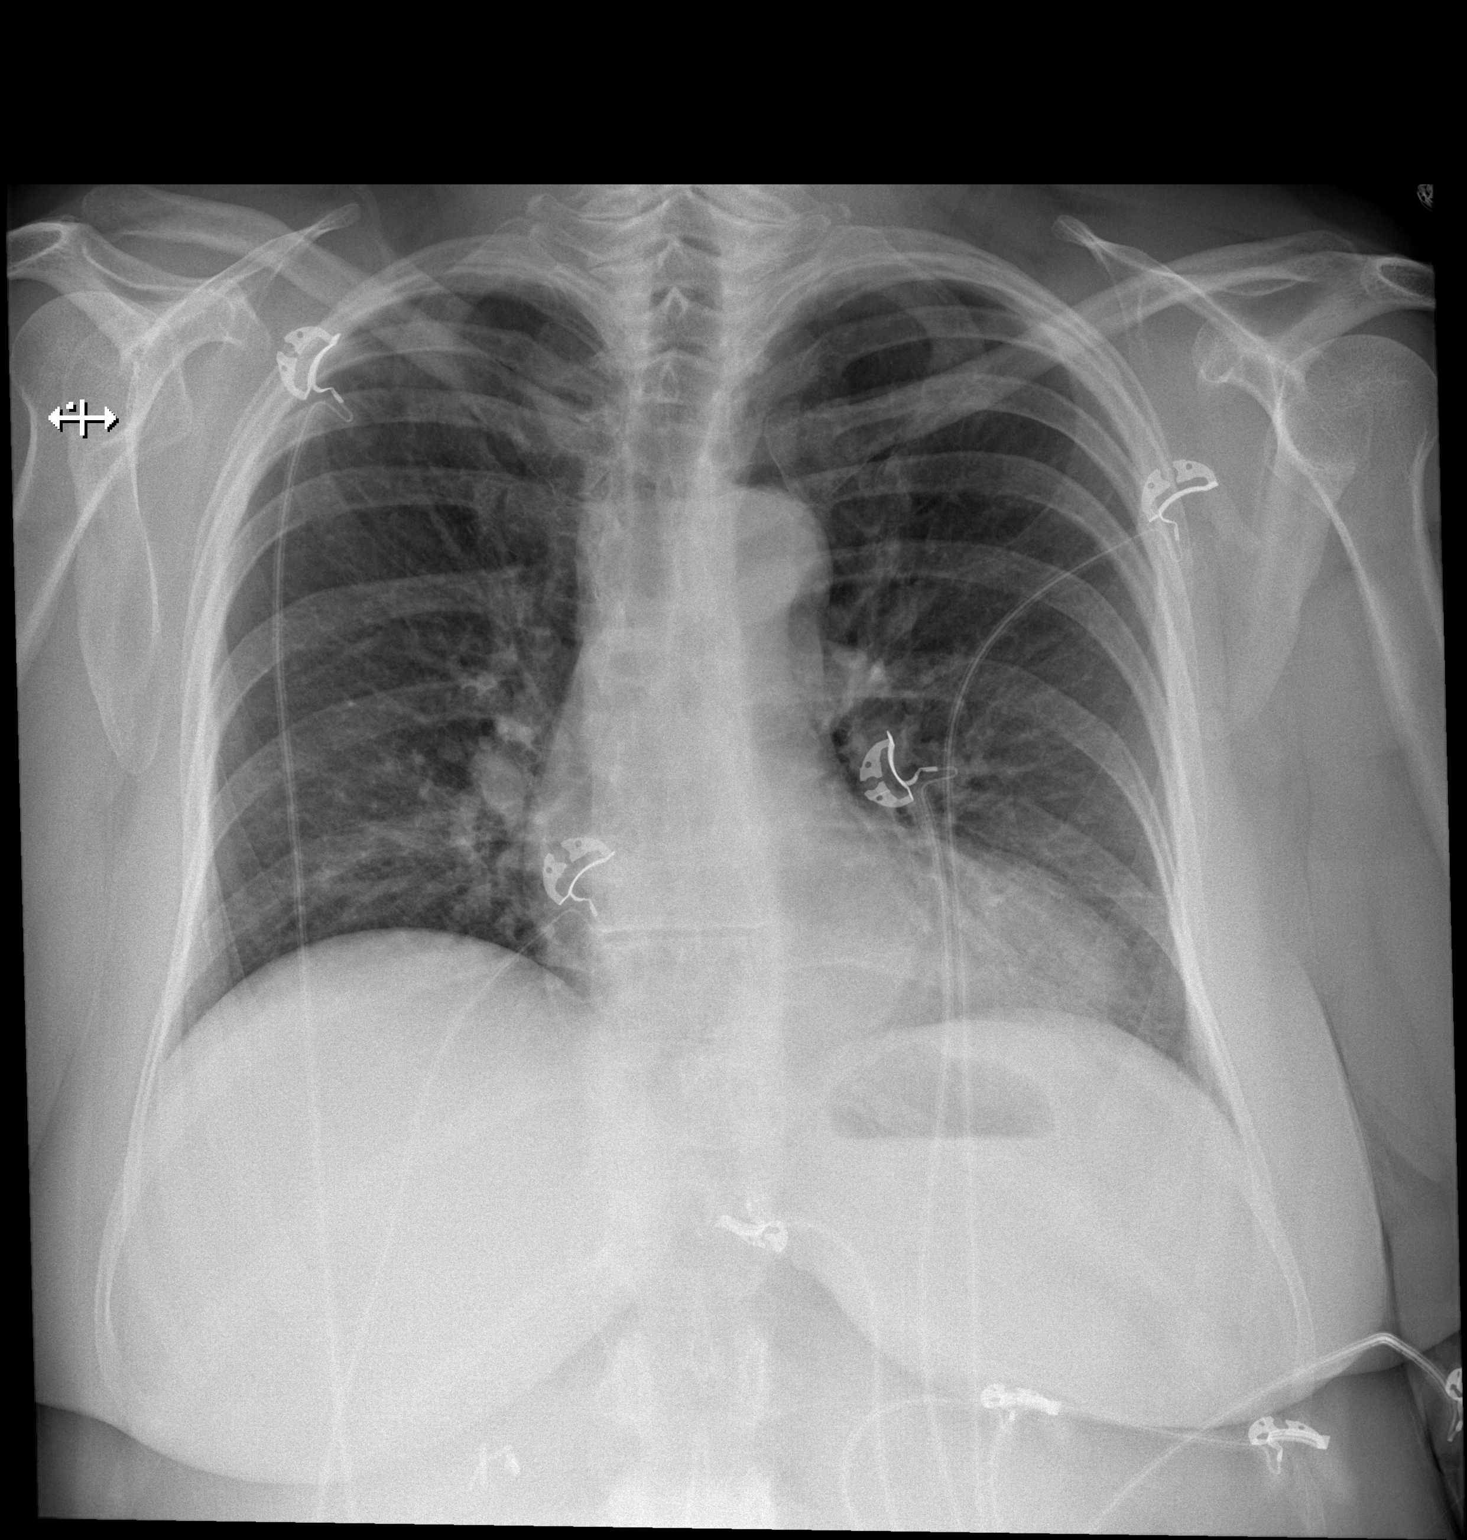

[w chest lat]
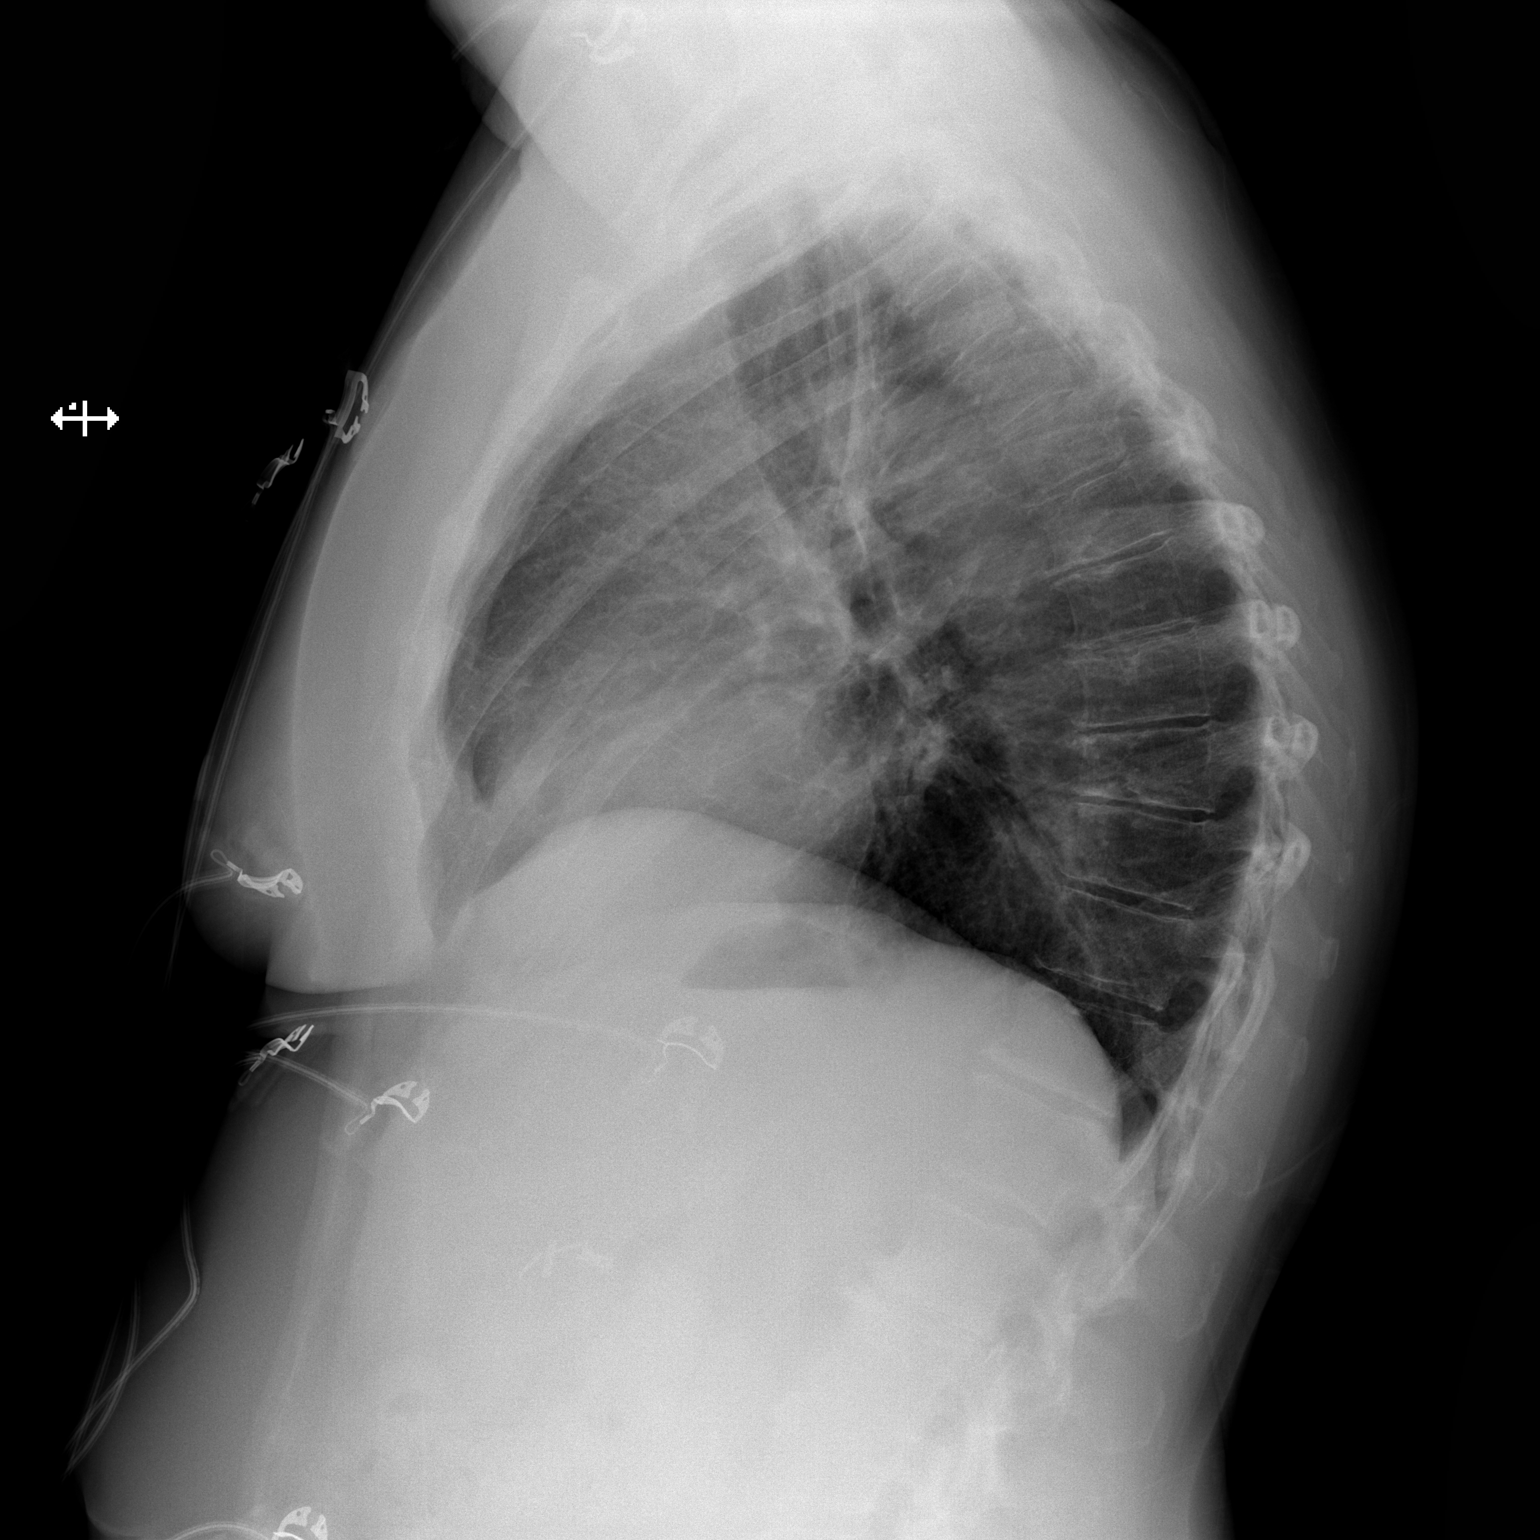

[2 of 2 positions shown; findings below may reference images not displayed]

FINDINGS: Cardiomediastinal silhouette is stable.  No acute
infiltrate or pleural effusion.  No pulmonary edema.  Bony thorax
is stable.
IMPRESSION: No active disease.  No significant change.

## 2014-09-04 ENCOUNTER — Other Ambulatory Visit: Payer: Self-pay

## 2014-09-04 DIAGNOSIS — Z1231 Encounter for screening mammogram for malignant neoplasm of breast: Secondary | ICD-10-CM

## 2014-10-20 ENCOUNTER — Ambulatory Visit
Admission: RE | Admit: 2014-10-20 | Discharge: 2014-10-20 | Disposition: A | Discharge status: Home / Self Care | Payer: PPO | Source: Ambulatory Visit

## 2014-10-20 DIAGNOSIS — Z1231 Encounter for screening mammogram for malignant neoplasm of breast: Secondary | ICD-10-CM

## 2014-11-06 ENCOUNTER — Other Ambulatory Visit: Payer: Self-pay | Admitting: Family Medicine

## 2014-11-06 DIAGNOSIS — E2839 Other primary ovarian failure: Secondary | ICD-10-CM

## 2014-12-15 ENCOUNTER — Inpatient Hospital Stay: Admission: RE | Admit: 2014-12-15 | Payer: Self-pay | Source: Ambulatory Visit

## 2015-01-14 ENCOUNTER — Ambulatory Visit
Admission: RE | Admit: 2015-01-14 | Discharge: 2015-01-14 | Disposition: A | Discharge status: Home / Self Care | Payer: PPO | Source: Ambulatory Visit | Attending: Family Medicine | Admitting: Family Medicine

## 2015-01-14 DIAGNOSIS — E2839 Other primary ovarian failure: Secondary | ICD-10-CM

## 2015-04-06 DIAGNOSIS — H40013 Open angle with borderline findings, low risk, bilateral: Secondary | ICD-10-CM | POA: Diagnosis not present

## 2015-09-13 ENCOUNTER — Other Ambulatory Visit: Payer: Self-pay | Admitting: Family Medicine

## 2015-09-13 DIAGNOSIS — Z1231 Encounter for screening mammogram for malignant neoplasm of breast: Secondary | ICD-10-CM

## 2015-09-16 DIAGNOSIS — L700 Acne vulgaris: Secondary | ICD-10-CM | POA: Diagnosis not present

## 2015-09-16 DIAGNOSIS — L570 Actinic keratosis: Secondary | ICD-10-CM | POA: Diagnosis not present

## 2015-11-02 ENCOUNTER — Ambulatory Visit
Admission: RE | Admit: 2015-11-02 | Discharge: 2015-11-02 | Disposition: A | Discharge status: Home / Self Care | Payer: PPO | Source: Ambulatory Visit | Attending: Family Medicine | Admitting: Family Medicine

## 2015-11-02 DIAGNOSIS — Z1231 Encounter for screening mammogram for malignant neoplasm of breast: Secondary | ICD-10-CM

## 2015-11-03 DIAGNOSIS — I10 Essential (primary) hypertension: Secondary | ICD-10-CM | POA: Diagnosis not present

## 2015-11-03 DIAGNOSIS — R7303 Prediabetes: Secondary | ICD-10-CM | POA: Diagnosis not present

## 2015-11-03 DIAGNOSIS — E039 Hypothyroidism, unspecified: Secondary | ICD-10-CM | POA: Diagnosis not present

## 2015-11-03 DIAGNOSIS — E7800 Pure hypercholesterolemia, unspecified: Secondary | ICD-10-CM | POA: Diagnosis not present

## 2015-11-09 DIAGNOSIS — Z0000 Encounter for general adult medical examination without abnormal findings: Secondary | ICD-10-CM | POA: Diagnosis not present

## 2015-11-09 DIAGNOSIS — I10 Essential (primary) hypertension: Secondary | ICD-10-CM | POA: Diagnosis not present

## 2015-11-09 DIAGNOSIS — E039 Hypothyroidism, unspecified: Secondary | ICD-10-CM | POA: Diagnosis not present

## 2015-11-09 DIAGNOSIS — Z23 Encounter for immunization: Secondary | ICD-10-CM | POA: Diagnosis not present

## 2015-11-09 DIAGNOSIS — E7800 Pure hypercholesterolemia, unspecified: Secondary | ICD-10-CM | POA: Diagnosis not present

## 2015-11-09 DIAGNOSIS — Z1389 Encounter for screening for other disorder: Secondary | ICD-10-CM | POA: Diagnosis not present

## 2015-11-09 DIAGNOSIS — R7303 Prediabetes: Secondary | ICD-10-CM | POA: Diagnosis not present

## 2015-11-23 DIAGNOSIS — H40013 Open angle with borderline findings, low risk, bilateral: Secondary | ICD-10-CM | POA: Diagnosis not present

## 2016-02-07 DIAGNOSIS — D1800 Hemangioma unspecified site: Secondary | ICD-10-CM | POA: Diagnosis not present

## 2016-02-07 DIAGNOSIS — L821 Other seborrheic keratosis: Secondary | ICD-10-CM | POA: Diagnosis not present

## 2016-02-07 DIAGNOSIS — L918 Other hypertrophic disorders of the skin: Secondary | ICD-10-CM | POA: Diagnosis not present

## 2016-02-07 DIAGNOSIS — Z23 Encounter for immunization: Secondary | ICD-10-CM | POA: Diagnosis not present

## 2016-02-07 DIAGNOSIS — Z86018 Personal history of other benign neoplasm: Secondary | ICD-10-CM | POA: Diagnosis not present

## 2016-02-07 DIAGNOSIS — L814 Other melanin hyperpigmentation: Secondary | ICD-10-CM | POA: Diagnosis not present

## 2016-02-07 DIAGNOSIS — D225 Melanocytic nevi of trunk: Secondary | ICD-10-CM | POA: Diagnosis not present

## 2016-05-01 DIAGNOSIS — Z1389 Encounter for screening for other disorder: Secondary | ICD-10-CM | POA: Diagnosis not present

## 2016-05-01 DIAGNOSIS — E039 Hypothyroidism, unspecified: Secondary | ICD-10-CM | POA: Diagnosis not present

## 2016-05-01 DIAGNOSIS — I10 Essential (primary) hypertension: Secondary | ICD-10-CM | POA: Diagnosis not present

## 2016-05-01 DIAGNOSIS — Z0000 Encounter for general adult medical examination without abnormal findings: Secondary | ICD-10-CM | POA: Diagnosis not present

## 2016-05-01 DIAGNOSIS — E7800 Pure hypercholesterolemia, unspecified: Secondary | ICD-10-CM | POA: Diagnosis not present

## 2016-05-01 DIAGNOSIS — R7303 Prediabetes: Secondary | ICD-10-CM | POA: Diagnosis not present

## 2016-05-01 DIAGNOSIS — Z23 Encounter for immunization: Secondary | ICD-10-CM | POA: Diagnosis not present

## 2016-05-09 DIAGNOSIS — I10 Essential (primary) hypertension: Secondary | ICD-10-CM | POA: Diagnosis not present

## 2016-05-09 DIAGNOSIS — E782 Mixed hyperlipidemia: Secondary | ICD-10-CM | POA: Diagnosis not present

## 2016-05-09 DIAGNOSIS — E039 Hypothyroidism, unspecified: Secondary | ICD-10-CM | POA: Diagnosis not present

## 2016-05-09 DIAGNOSIS — E7800 Pure hypercholesterolemia, unspecified: Secondary | ICD-10-CM | POA: Diagnosis not present

## 2016-09-25 ENCOUNTER — Other Ambulatory Visit: Payer: Self-pay | Admitting: Family Medicine

## 2016-09-25 DIAGNOSIS — Z1231 Encounter for screening mammogram for malignant neoplasm of breast: Secondary | ICD-10-CM

## 2016-09-29 DIAGNOSIS — M170 Bilateral primary osteoarthritis of knee: Secondary | ICD-10-CM | POA: Diagnosis not present

## 2016-09-29 DIAGNOSIS — M1711 Unilateral primary osteoarthritis, right knee: Secondary | ICD-10-CM | POA: Diagnosis not present

## 2016-09-29 DIAGNOSIS — M1712 Unilateral primary osteoarthritis, left knee: Secondary | ICD-10-CM | POA: Diagnosis not present

## 2016-10-31 DIAGNOSIS — E039 Hypothyroidism, unspecified: Secondary | ICD-10-CM | POA: Diagnosis not present

## 2016-10-31 DIAGNOSIS — E782 Mixed hyperlipidemia: Secondary | ICD-10-CM | POA: Diagnosis not present

## 2016-10-31 DIAGNOSIS — R269 Unspecified abnormalities of gait and mobility: Secondary | ICD-10-CM | POA: Diagnosis not present

## 2016-10-31 DIAGNOSIS — I10 Essential (primary) hypertension: Secondary | ICD-10-CM | POA: Diagnosis not present

## 2016-10-31 DIAGNOSIS — Z01818 Encounter for other preprocedural examination: Secondary | ICD-10-CM | POA: Diagnosis not present

## 2016-10-31 DIAGNOSIS — R7303 Prediabetes: Secondary | ICD-10-CM | POA: Diagnosis not present

## 2016-11-01 DIAGNOSIS — E039 Hypothyroidism, unspecified: Secondary | ICD-10-CM | POA: Diagnosis not present

## 2016-11-01 DIAGNOSIS — R7303 Prediabetes: Secondary | ICD-10-CM | POA: Diagnosis not present

## 2016-11-01 DIAGNOSIS — E782 Mixed hyperlipidemia: Secondary | ICD-10-CM | POA: Diagnosis not present

## 2016-11-01 DIAGNOSIS — I10 Essential (primary) hypertension: Secondary | ICD-10-CM | POA: Diagnosis not present

## 2016-11-08 NOTE — Progress Notes (Signed)
Please place orders in EPIC as patient is being scheduled for a pre-op appointment! Thank you! 

## 2016-11-14 ENCOUNTER — Ambulatory Visit
Admission: RE | Admit: 2016-11-14 | Discharge: 2016-11-14 | Disposition: A | Discharge status: Home / Self Care | Payer: Self-pay | Source: Ambulatory Visit | Attending: Family Medicine | Admitting: Family Medicine

## 2016-11-14 ENCOUNTER — Ambulatory Visit: Payer: Self-pay | Admitting: Orthopedic Surgery

## 2016-11-14 DIAGNOSIS — Z1231 Encounter for screening mammogram for malignant neoplasm of breast: Secondary | ICD-10-CM | POA: Diagnosis not present

## 2016-11-14 NOTE — H&P (Signed)
Krystal Kent DOB: [birth year] Married / Language: English / Race: White Female Date of Admission:  12/04/2016 CC:  Right Knee pain History of Present Illness  The patient is a 71 year old female who comes in for a preoperative History and Physical. The patient is scheduled for a right total knee arthroplasty to be performed by Dr. Dione Plover. Aluisio, MD at [facility] on 12/04/2016. The patient is a 71 year old female who presented for follow up of their knee. The patient is being followed for their right knee pain and osteoarthritis. They are now 3 year(s) out from last evaluation in our office. Symptoms reported include: pain, aching, stiffness, popping, instability, difficulty ambulating and difficulty arising from chair. The patient feels that they are doing poorly. The following medication has been used for pain control: Tylenol. She is 9 years out from left total knee, done by Dr. Onnie Graham. She states the left knee is doing well.The RIGHT knee is now hurting her at all times. Is limiting what she can and cannot do. For the past several months she has barely been getting around. She has documented end-stage arthritis of the RIGHT knee from x-rays 3 years ago. Her symptoms have gotten progressively worse now. She is ready to proceed with the total knee arthroplasty on the right knee at this time. They have been treated conservatively in the past for the above stated problem and despite conservative measures, they continue to have progressive pain and severe functional limitations and dysfunction. They have failed non-operative management including home exercise, medications, and injections. It is felt that they would benefit from undergoing total joint replacement. Risks and benefits of the procedure have been discussed with the patient and they elect to proceed with surgery. There are no active contraindications to surgery such as ongoing infection or rapidly progressive neurological  disease.   Problem List/Past Medical Primary osteoarthritis of left knee (M17.12)  S/P total knee arthroplasty, left (N82.956)  High blood pressure  Hypercholesterolemia  Hypothyroidism  Osteoarthritis   Allergies  Demerol *ANALGESICS - OPIOID*  Nausea, Vomiting.  Family History Cancer  Father, Sister. Hypertension  Brother, Father, Mother, Sister. mother and father Rheumatoid Arthritis  Mother. mother  Social History Alcohol use  current drinker; only occasionally per week Children  2 Current drinker  10/21/2013: Currently drinks only occasionally per week Copy of Drug/Alcohol Rehab (Previously)  no Current work status  retired Furniture conservator/restorer daily; does running / walking and gym / [employer] Drug/Alcohol Rehab (Currently)  no Living situation  live with spouse Marital status  married No history of drug/alcohol rehab  Illicit drug use  no Not under pain contract  Number of flights of stairs before winded  greater than 5 Pain Contract  no Tobacco use  Never smoker. 10/21/2013 never smoker Tobacco / smoke exposure  no Post-Surgical Plans  Home With Family, Home, Straight to Outpatient Therapy. Advance Directives  Living Will, Healthcare Power of [place].  Medication History Glucosamine Chondroitin Adv (Oral) Active. Calcium (Oral) Specific strength unknown - Active. Multivitamin Adult (Oral) Active. Levothyroxine Sodium (75MCG Tablet, Oral) Active. Fenofibrate (48MG  Tablet, Oral) Active. Triamterene-HCTZ (37.5-25MG  Tablet, Oral) Active. Atorvastatin Calcium (20MG  Tablet, Oral) Active. AmLODIPine Besylate (10MG  Tablet, Oral) Active.  Past Surgical History Appendectomy  Gallbladder Surgery  open and laporoscopic Hysterectomy  complete (non-cancerous) Total Knee Replacement  left   Review of Systems General Not Present- Chills, Fatigue, Fever, Memory Loss, Night Sweats, Weight Gain and Weight Loss. Skin Not Present-  Eczema,  Hives, Itching, Lesions and Rash. HEENT Not Present- Dentures, Double Vision, Headache, Hearing Loss, Tinnitus and Visual Loss. Respiratory Present- Allergies. Not Present- Chronic Cough, Coughing up blood, Shortness of breath at rest and Shortness of breath with exertion. Cardiovascular Present- Hypertension. Not Present- Chest Pain, Difficulty Breathing Lying Down, Murmur, Palpitations, Racing/skipping heartbeats and Swelling. Gastrointestinal Not Present- Abdominal Pain, Bloody Stool, Constipation, Diarrhea, Difficulty Swallowing, Heartburn, Jaundice, Loss of appetitie, Nausea and Vomiting. Female Genitourinary Not Present- Blood in Urine, Discharge, Flank Pain, Incontinence, Painful Urination, Urgency, Urinary frequency, Urinary Retention, Urinating at Night and Weak urinary stream. Musculoskeletal Present- Joint Stiffness. Not Present- Back Pain, Joint Pain, Joint Swelling, Morning Stiffness, Muscle Pain, Muscle Weakness and Spasms. Neurological Not Present- Blackout spells, Difficulty with balance, Dizziness, Paralysis, Tremor and Weakness. Psychiatric Not Present- Insomnia. Endocrine Present- Thyroid Problems.  Vitals Weight: 166 lb Height: 62in Weight was reported by patient. Height was reported by patient. Body Surface Area: 1.77 m Body Mass Index: 30.36 kg/m  Pulse: 78 (Regular)  BP: 132/80 (Sitting, Right Arm, Standard)  Physical Exam General Mental Status -Alert, cooperative and good historian. General Appearance-pleasant, Not in acute distress. Orientation-Oriented X3. Build & Nutrition-Well nourished and Well developed.  Head and Neck Head-normocephalic, atraumatic . Neck Global Assessment - supple, no bruit auscultated on the right, no bruit auscultated on the left.  Eye Pupil - Bilateral-Regular and Round. Motion - Bilateral-EOMI.  Chest and Lung Exam Auscultation Breath sounds - clear at anterior chest wall and clear at posterior  chest wall. Adventitious sounds - No Adventitious sounds.  Cardiovascular Auscultation Rhythm - Regular rate and rhythm. Heart Sounds - S1 WNL and S2 WNL. Murmurs & Other Heart Sounds - Auscultation of the heart reveals - No Murmurs.  Abdomen Palpation/Percussion Tenderness - Abdomen is non-tender to palpation. Rigidity (guarding) - Abdomen is soft. Auscultation Auscultation of the abdomen reveals - Bowel sounds normal.  Female Genitourinary Note: Not done, not pertinent to present illness   Musculoskeletal Note: Evaluation of the left hip shows flexion to 120 rotation in 30 out 40 and abduction 40 without discomfort. There is no tenderness over the greater trochanter. There is no pain on provocative testing of the hip.Examination of the right hip shows flexion to 120 rotation in 30 abduction 40 and external rotation of 40. There is no tenderness over the greater trochanter. There is no pain on provocative testing of the hip. Her LEFT knee shows no effusion. Her range of motion is 0-110. There is no instability. Her RIGHT knee shows no effusion range of motion is 10-105. She has tenderness over the medial and lateral joint lines. There is marked crepitus on range of motion. There is no instability. Gait pattern is significantly antalgic on the RIGHT.  Radiographs-AP and lateral of the RIGHT knee show tricompartmental end-stage arthritis bone-on-bone in all 3 compartments. We also had AP of her LEFT knee showing a [employer] prosthesis in good position with no periprosthetic abnormalities.   Assessment & Plan  S/P total knee arthroplasty, left (Z32.992)  Primary osteoarthritis of one knee, right (M17.11)  Note:Surgical Plans: Right Total Knee Replacement  Disposition: Home with assistance, Patient instructed to schedule therapy at [facility] on Friday Oct. 12th  PCP: Dr. Earle Gell - Patient has been seen preoperatively and felt to be stable for surgery.  "low risk for cardiovascular complications with surgery".  IV TXA  Anesthesia Issues: None except for nausea  Patient was instructed on what medications to stop prior to surgery.  Preop Lab with PCP Hgb A1C - 6.4  Signed electronically by Joelene Millin, III PA-C

## 2016-11-16 ENCOUNTER — Ambulatory Visit: Payer: Self-pay | Admitting: Orthopedic Surgery

## 2016-11-20 DIAGNOSIS — E039 Hypothyroidism, unspecified: Secondary | ICD-10-CM | POA: Diagnosis not present

## 2016-11-20 DIAGNOSIS — R7303 Prediabetes: Secondary | ICD-10-CM | POA: Diagnosis not present

## 2016-11-20 DIAGNOSIS — Z0000 Encounter for general adult medical examination without abnormal findings: Secondary | ICD-10-CM | POA: Diagnosis not present

## 2016-11-20 DIAGNOSIS — Z1389 Encounter for screening for other disorder: Secondary | ICD-10-CM | POA: Diagnosis not present

## 2016-11-20 DIAGNOSIS — I10 Essential (primary) hypertension: Secondary | ICD-10-CM | POA: Diagnosis not present

## 2016-11-20 DIAGNOSIS — Z23 Encounter for immunization: Secondary | ICD-10-CM | POA: Diagnosis not present

## 2016-11-20 DIAGNOSIS — E782 Mixed hyperlipidemia: Secondary | ICD-10-CM | POA: Diagnosis not present

## 2016-11-22 DIAGNOSIS — H2513 Age-related nuclear cataract, bilateral: Secondary | ICD-10-CM | POA: Diagnosis not present

## 2016-11-28 ENCOUNTER — Other Ambulatory Visit (HOSPITAL_COMMUNITY): Payer: Self-pay | Admitting: Emergency Medicine

## 2016-11-28 NOTE — Progress Notes (Signed)
Clearance on chart Dr Leighton Ruff 10-31-16 on chart from [facility]   EKG on chart from Dr Drema Dallas office

## 2016-11-28 NOTE — Patient Instructions (Addendum)
Krystal Kent  11/28/2016   Your procedure is scheduled on: 12-04-16  Report to [facility] Main  Entrance    Report to admitting at 6AM   Call this number if you have problems the morning of surgery [phone]   Remember: ONLY 1 PERSON MAY GO WITH YOU TO SHORT STAY TO GET  READY MORNING OF YOUR SURGERY.  Do not eat food or drink liquids After Midnight!     Take these medicines the morning of surgery with A SIP OF WATER: amlodipine(norvasc), atorvastatin(lipitor), levothyroxine(synthroid)                                 You may not have any metal on your body including hair pins and              piercings  Do not wear jewelry, make-up, lotions, powders or perfumes, deodorant             Do not wear nail polish.  Do not shave  48 hours prior to surgery.                 Do not bring valuables to the hospital. [place].  Contacts, dentures or bridgework may not be worn into surgery.  Leave suitcase in the car. After surgery it may be brought to your room.               Please read over the following fact sheets you were given: _____________________________________________________________________    [facility] - Preparing for Surgery Before surgery, you can play an important role.  Because skin is not sterile, your skin needs to be as free of germs as possible.  You can reduce the number of germs on your skin by washing with CHG (chlorahexidine gluconate) soap before surgery.  CHG is an antiseptic cleaner which kills germs and bonds with the skin to continue killing germs even after washing. Please DO NOT use if you have an allergy to CHG or antibacterial soaps.  If your skin becomes reddened/irritated stop using the CHG and inform your nurse when you arrive at Short Stay. Do not shave (including legs and underarms) for at least 48 hours prior to the first CHG shower.  You may shave your face/neck. Please  follow these instructions carefully:  1.  Shower with CHG Soap the night before surgery and the  morning of Surgery.  2.  If you choose to wash your hair, wash your hair first as usual with your  normal  shampoo.  3.  After you shampoo, rinse your hair and body thoroughly to remove the  shampoo.                           4.  Use CHG as you would any other liquid soap.  You can apply chg directly  to the skin and wash                       Gently with a scrungie or clean washcloth.  5.  Apply the CHG Soap to your body ONLY FROM THE NECK DOWN.   Do not use on face/ open  Wound or open sores. Avoid contact with eyes, ears mouth and genitals (private parts).                       Wash face,  Genitals (private parts) with your normal soap.             6.  Wash thoroughly, paying special attention to the area where your surgery  will be performed.  7.  Thoroughly rinse your body with warm water from the neck down.  8.  DO NOT shower/wash with your normal soap after using and rinsing off  the CHG Soap.                9.  Pat yourself dry with a clean towel.            10.  Wear clean pajamas.            11.  Place clean sheets on your bed the night of your first shower and do not  sleep with pets. Day of Surgery : Do not apply any lotions/deodorants the morning of surgery.  Please wear clean clothes to the hospital/surgery center.  FAILURE TO FOLLOW THESE INSTRUCTIONS MAY RESULT IN THE CANCELLATION OF YOUR SURGERY PATIENT SIGNATURE_________________________________  NURSE SIGNATURE__________________________________  ________________________________________________________________________   [name]  An incentive spirometer is a tool that can help keep your lungs clear and active. This tool measures how well you are filling your lungs with each breath. Taking long deep breaths may help reverse or decrease the chance of developing breathing (pulmonary) problems  (especially infection) following:  A long period of time when you are unable to move or be active. BEFORE THE PROCEDURE   If the spirometer includes an indicator to show your best effort, your nurse or respiratory therapist will set it to a desired goal.  If possible, sit up straight or lean slightly forward. Try not to slouch.  Hold the incentive spirometer in an upright position. INSTRUCTIONS FOR USE  1. Sit on the edge of your bed if possible, or sit up as far as you can in bed or on a chair. 2. Hold the incentive spirometer in an upright position. 3. Breathe out normally. 4. Place the mouthpiece in your mouth and seal your lips tightly around it. 5. Breathe in slowly and as deeply as possible, raising the piston or the ball toward the top of the column. 6. Hold your breath for 3-5 seconds or for as long as possible. Allow the piston or ball to fall to the bottom of the column. 7. Remove the mouthpiece from your mouth and breathe out normally. 8. Rest for a few seconds and repeat Steps 1 through 7 at least 10 times every 1-2 hours when you are awake. Take your time and take a few normal breaths between deep breaths. 9. The spirometer may include an indicator to show your best effort. Use the indicator as a goal to work toward during each repetition. 10. After each set of 10 deep breaths, practice coughing to be sure your lungs are clear. If you have an incision (the cut made at the time of surgery), support your incision when coughing by placing a pillow or rolled up towels firmly against it. Once you are able to get out of bed, walk around indoors and cough well. You may stop using the incentive spirometer when instructed by your caregiver.  RISKS AND COMPLICATIONS  Take your time so you do not get  dizzy or light-headed.  If you are in pain, you may need to take or ask for pain medication before doing incentive spirometry. It is harder to take a deep breath if you are having  pain. AFTER USE  Rest and breathe slowly and easily.  It can be helpful to keep track of a log of your progress. Your caregiver can provide you with a simple table to help with this. If you are using the spirometer at home, follow these instructions: [place] IF:   You are having difficultly using the spirometer.  You have trouble using the spirometer as often as instructed.  Your pain medication is not giving enough relief while using the spirometer.  You develop fever of 100.5 F (38.1 C) or higher. SEEK IMMEDIATE MEDICAL CARE IF:   You cough up bloody sputum that had not been present before.  You develop fever of 102 F (38.9 C) or greater.  You develop worsening pain at or near the incision site. MAKE SURE YOU:   Understand these instructions.  Will watch your condition.  Will get help right away if you are not doing well or get worse. Document Released: 06/26/2006 Document Revised: 05/08/2011 Document Reviewed: 08/27/2006 ExitCare Patient Information 2014 ExitCare, [place].   ________________________________________________________________________  WHAT IS A BLOOD TRANSFUSION? Blood Transfusion Information  A transfusion is the replacement of blood or some of its parts. Blood is made up of multiple cells which provide different functions.  Red blood cells carry oxygen and are used for blood loss replacement.  White blood cells fight against infection.  Platelets control bleeding.  Plasma helps clot blood.  Other blood products are available for specialized needs, such as hemophilia or other clotting disorders. BEFORE THE TRANSFUSION  Who gives blood for transfusions?   Healthy volunteers who are fully evaluated to make sure their blood is safe. This is blood bank blood. Transfusion therapy is the safest it has ever been in the practice of medicine. Before blood is taken from a donor, a complete history is taken to make sure that person has no history  of diseases nor engages in risky social behavior (examples are intravenous drug use or sexual activity with multiple partners). The donor's travel history is screened to minimize risk of transmitting infections, such as malaria. The donated blood is tested for signs of infectious diseases, such as HIV and hepatitis. The blood is then tested to be sure it is compatible with you in order to minimize the chance of a transfusion reaction. If you or a relative donates blood, this is often done in anticipation of surgery and is not appropriate for emergency situations. It takes many days to process the donated blood. RISKS AND COMPLICATIONS Although transfusion therapy is very safe and saves many lives, the main dangers of transfusion include:   Getting an infectious disease.  Developing a transfusion reaction. This is an allergic reaction to something in the blood you were given. Every precaution is taken to prevent this. The decision to have a blood transfusion has been considered carefully by your caregiver before blood is given. Blood is not given unless the benefits outweigh the risks. AFTER THE TRANSFUSION  Right after receiving a blood transfusion, you will usually feel much better and more energetic. This is especially true if your red blood cells have gotten low (anemic). The transfusion raises the level of the red blood cells which carry oxygen, and this usually causes an energy increase.  The nurse administering the transfusion will  monitor you carefully for complications. HOME CARE INSTRUCTIONS  No special instructions are needed after a transfusion. You may find your energy is better. Speak with your caregiver about any limitations on activity for underlying diseases you may have. SEEK MEDICAL CARE IF:   Your condition is not improving after your transfusion.  You develop redness or irritation at the intravenous (IV) site. SEEK IMMEDIATE MEDICAL CARE IF:  Any of the following symptoms  occur over the next 12 hours:  Shaking chills.  You have a temperature by mouth above 102 F (38.9 C), not controlled by medicine.  Chest, back, or muscle pain.  People around you feel you are not acting correctly or are confused.  Shortness of breath or difficulty breathing.  Dizziness and fainting.  You get a rash or develop hives.  You have a decrease in urine output.  Your urine turns a dark color or changes to pink, red, or brown. Any of the following symptoms occur over the next 10 days:  You have a temperature by mouth above 102 F (38.9 C), not controlled by medicine.  Shortness of breath.  Weakness after normal activity.  The white part of the eye turns yellow (jaundice).  You have a decrease in the amount of urine or are urinating less often.  Your urine turns a dark color or changes to pink, red, or brown. Document Released: 02/11/2000 Document Revised: 05/08/2011 Document Reviewed: 09/30/2007 [facility] Patient Information 2014 [place], [place].  _______________________________________________________________________

## 2016-11-29 ENCOUNTER — Encounter (HOSPITAL_COMMUNITY)
Admission: RE | Admit: 2016-11-29 | Discharge: 2016-11-29 | Disposition: A | Discharge status: Home / Self Care | Payer: PPO | Source: Ambulatory Visit | Attending: Orthopedic Surgery | Admitting: Orthopedic Surgery

## 2016-11-29 ENCOUNTER — Encounter (HOSPITAL_COMMUNITY): Payer: Self-pay

## 2016-11-29 DIAGNOSIS — Z01812 Encounter for preprocedural laboratory examination: Secondary | ICD-10-CM | POA: Diagnosis not present

## 2016-11-29 DIAGNOSIS — M1711 Unilateral primary osteoarthritis, right knee: Secondary | ICD-10-CM | POA: Insufficient documentation

## 2016-11-29 HISTORY — DX: Nausea with vomiting, unspecified: Z98.890

## 2016-11-29 HISTORY — DX: Prediabetes: R73.03

## 2016-11-29 HISTORY — DX: Nausea with vomiting, unspecified: R11.2

## 2016-11-29 LAB — COMPREHENSIVE METABOLIC PANEL
ALK PHOS: 38 U/L (ref 38–126)
ALT: 28 U/L (ref 14–54)
AST: 28 U/L (ref 15–41)
Albumin: 4.4 g/dL (ref 3.5–5.0)
Anion gap: 9 (ref 5–15)
BILIRUBIN TOTAL: 0.6 mg/dL (ref 0.3–1.2)
BUN: 23 mg/dL — ABNORMAL HIGH (ref 6–20)
CALCIUM: 9.7 mg/dL (ref 8.9–10.3)
CHLORIDE: 106 mmol/L (ref 101–111)
CO2: 25 mmol/L (ref 22–32)
CREATININE: 0.91 mg/dL (ref 0.44–1.00)
GFR calc Af Amer: >60 mL/min (ref >60)
Glucose, Bld: 112 mg/dL — ABNORMAL HIGH (ref 65–99)
Potassium: 4.2 mmol/L (ref 3.5–5.1)
Sodium: 140 mmol/L (ref 135–145)
TOTAL PROTEIN: 7.7 g/dL (ref 6.5–8.1)

## 2016-11-29 LAB — APTT: APTT: 26 s (ref 24–36)

## 2016-11-29 LAB — CBC
HCT: 41.4 % (ref 36.0–46.0)
HEMOGLOBIN: 13.8 g/dL (ref 12.0–15.0)
MCH: 30.7 pg (ref 26.0–34.0)
MCHC: 33.3 g/dL (ref 30.0–36.0)
MCV: 92 fL (ref 78.0–100.0)
PLATELETS: 222 10*3/uL (ref 150–400)
RBC: 4.5 MIL/uL (ref 3.87–5.11)
RDW: 13.2 % (ref 11.5–15.5)
WBC: 5.3 10*3/uL (ref 4.0–10.5)

## 2016-11-29 LAB — ABO/RH: ABO/RH(D): A POS

## 2016-11-29 LAB — PROTIME-INR
INR: 0.91
PROTHROMBIN TIME: 12.1 s (ref 11.4–15.2)

## 2016-11-29 LAB — SURGICAL PCR SCREEN
MRSA, PCR: NEGATIVE
STAPHYLOCOCCUS AUREUS: NEGATIVE

## 2016-11-29 NOTE — Progress Notes (Signed)
HGA1c  on chart from [facility] physicians dated 11-01-16

## 2016-11-30 NOTE — Progress Notes (Signed)
ekg 10-31-16 dr Leighton Ruff on chart

## 2016-12-04 ENCOUNTER — Encounter (HOSPITAL_COMMUNITY)
Admission: RE | Disposition: A | Discharge status: Home / Self Care | Payer: Self-pay | Source: Ambulatory Visit | Attending: Orthopedic Surgery

## 2016-12-04 ENCOUNTER — Encounter (HOSPITAL_COMMUNITY): Payer: Self-pay | Admitting: Certified Registered Nurse Anesthetist

## 2016-12-04 ENCOUNTER — Inpatient Hospital Stay (HOSPITAL_COMMUNITY): Payer: PPO | Admitting: Certified Registered Nurse Anesthetist

## 2016-12-04 ENCOUNTER — Inpatient Hospital Stay (HOSPITAL_COMMUNITY)
Admission: RE | Admit: 2016-12-04 | Discharge: 2016-12-06 | DRG: 470 | Disposition: A | Discharge status: Home / Self Care | Payer: PPO | Source: Ambulatory Visit | Attending: Orthopedic Surgery | Admitting: Orthopedic Surgery

## 2016-12-04 DIAGNOSIS — E7800 Pure hypercholesterolemia, unspecified: Secondary | ICD-10-CM | POA: Diagnosis not present

## 2016-12-04 DIAGNOSIS — G8918 Other acute postprocedural pain: Secondary | ICD-10-CM | POA: Diagnosis not present

## 2016-12-04 DIAGNOSIS — Z79899 Other long term (current) drug therapy: Secondary | ICD-10-CM | POA: Diagnosis not present

## 2016-12-04 DIAGNOSIS — M1711 Unilateral primary osteoarthritis, right knee: Secondary | ICD-10-CM | POA: Diagnosis not present

## 2016-12-04 DIAGNOSIS — Z90710 Acquired absence of both cervix and uterus: Secondary | ICD-10-CM | POA: Diagnosis not present

## 2016-12-04 DIAGNOSIS — M179 Osteoarthritis of knee, unspecified: Secondary | ICD-10-CM | POA: Diagnosis present

## 2016-12-04 DIAGNOSIS — Z79890 Hormone replacement therapy (postmenopausal): Secondary | ICD-10-CM | POA: Diagnosis not present

## 2016-12-04 DIAGNOSIS — E039 Hypothyroidism, unspecified: Secondary | ICD-10-CM | POA: Diagnosis not present

## 2016-12-04 DIAGNOSIS — I10 Essential (primary) hypertension: Secondary | ICD-10-CM | POA: Diagnosis present

## 2016-12-04 DIAGNOSIS — Z96652 Presence of left artificial knee joint: Secondary | ICD-10-CM | POA: Diagnosis present

## 2016-12-04 DIAGNOSIS — M1710 Unilateral primary osteoarthritis, unspecified knee: Secondary | ICD-10-CM | POA: Diagnosis present

## 2016-12-04 HISTORY — PX: TOTAL KNEE ARTHROPLASTY: SHX125

## 2016-12-04 LAB — TYPE AND SCREEN
ABO/RH(D): A POS
ANTIBODY SCREEN: NEGATIVE

## 2016-12-04 SURGERY — ARTHROPLASTY, KNEE, TOTAL
Anesthesia: Spinal | Site: Knee | Laterality: Right

## 2016-12-04 MED ORDER — EPHEDRINE SULFATE 50 MG/ML IJ SOLN
INTRAMUSCULAR | Status: DC | PRN
  Administered 2016-12-04 (×4): 5 mg via INTRAVENOUS

## 2016-12-04 MED ORDER — FENTANYL CITRATE (PF) 100 MCG/2ML IJ SOLN
INTRAMUSCULAR | Status: AC
  Filled 2016-12-04: qty 2

## 2016-12-04 MED ORDER — ACETAMINOPHEN 500 MG PO TABS
1000.0000 mg | ORAL_TABLET | Freq: Four times a day (QID) | ORAL | Status: AC
  Administered 2016-12-04 – 2016-12-05 (×3): 1000 mg via ORAL
  Filled 2016-12-04 (×4): qty 2

## 2016-12-04 MED ORDER — TRIAMTERENE-HCTZ 37.5-25 MG PO TABS
1.0000 | ORAL_TABLET | Freq: Every day | ORAL | Status: DC
  Administered 2016-12-05 – 2016-12-06 (×2): 1 via ORAL
  Filled 2016-12-04 (×2): qty 1

## 2016-12-04 MED ORDER — SODIUM CHLORIDE 0.9 % IJ SOLN
INTRAMUSCULAR | Status: DC | PRN
  Administered 2016-12-04: 50 mL
  Administered 2016-12-04: 10 mL

## 2016-12-04 MED ORDER — METHOCARBAMOL 500 MG PO TABS
500.0000 mg | ORAL_TABLET | Freq: Four times a day (QID) | ORAL | Status: DC | PRN
  Administered 2016-12-04 – 2016-12-06 (×5): 500 mg via ORAL
  Filled 2016-12-04 (×5): qty 1

## 2016-12-04 MED ORDER — TRAMADOL HCL 50 MG PO TABS
50.0000 mg | ORAL_TABLET | Freq: Four times a day (QID) | ORAL | Status: DC | PRN
  Filled 2016-12-04: qty 2

## 2016-12-04 MED ORDER — MIDAZOLAM HCL 5 MG/5ML IJ SOLN
INTRAMUSCULAR | Status: DC | PRN
  Administered 2016-12-04 (×2): 1 mg via INTRAVENOUS

## 2016-12-04 MED ORDER — DEXAMETHASONE SODIUM PHOSPHATE 10 MG/ML IJ SOLN
INTRAMUSCULAR | Status: AC
  Filled 2016-12-04: qty 1

## 2016-12-04 MED ORDER — LEVOTHYROXINE SODIUM 75 MCG PO TABS
75.0000 ug | ORAL_TABLET | Freq: Every day | ORAL | Status: DC
  Administered 2016-12-05 – 2016-12-06 (×2): 75 ug via ORAL
  Filled 2016-12-04 (×2): qty 1

## 2016-12-04 MED ORDER — PHENOL 1.4 % MT LIQD
1.0000 | OROMUCOSAL | Status: DC | PRN
  Filled 2016-12-04: qty 177

## 2016-12-04 MED ORDER — MIDAZOLAM HCL 2 MG/2ML IJ SOLN
INTRAMUSCULAR | Status: AC
  Filled 2016-12-04: qty 2

## 2016-12-04 MED ORDER — SODIUM CHLORIDE 0.9 % IJ SOLN
INTRAMUSCULAR | Status: AC
  Filled 2016-12-04: qty 10

## 2016-12-04 MED ORDER — SODIUM CHLORIDE 0.9 % IR SOLN
Status: DC | PRN
  Administered 2016-12-04: 1000 mL

## 2016-12-04 MED ORDER — BISACODYL 10 MG RE SUPP: 10.0000 mg | Freq: Every day | RECTAL | Status: DC | PRN

## 2016-12-04 MED ORDER — CEFAZOLIN SODIUM-DEXTROSE 2-4 GM/100ML-% IV SOLN
2.0000 g | Freq: Four times a day (QID) | INTRAVENOUS | Status: AC
  Administered 2016-12-04 (×2): 2 g via INTRAVENOUS
  Filled 2016-12-04 (×2): qty 100

## 2016-12-04 MED ORDER — DIPHENHYDRAMINE HCL 12.5 MG/5ML PO ELIX
12.5000 mg | ORAL_SOLUTION | ORAL | Status: DC | PRN
Start: 2016-12-04 — End: 2016-12-06

## 2016-12-04 MED ORDER — SODIUM CHLORIDE 0.9 % IV SOLN
INTRAVENOUS | Status: DC
  Administered 2016-12-04: 12:00:00 75 mL/h via INTRAVENOUS
  Administered 2016-12-05: 02:00:00 via INTRAVENOUS

## 2016-12-04 MED ORDER — GABAPENTIN 300 MG PO CAPS
300.0000 mg | ORAL_CAPSULE | Freq: Once | ORAL | Status: AC
  Administered 2016-12-04: 300 mg via ORAL

## 2016-12-04 MED ORDER — FLEET ENEMA 7-19 GM/118ML RE ENEM: 1.0000 | ENEMA | Freq: Once | RECTAL | Status: DC | PRN

## 2016-12-04 MED ORDER — OXYCODONE HCL 5 MG PO TABS
5.0000 mg | ORAL_TABLET | ORAL | Status: DC | PRN
  Administered 2016-12-04: 13:00:00 5 mg via ORAL
  Administered 2016-12-04 (×2): 10 mg via ORAL
  Administered 2016-12-04: 12:00:00 5 mg via ORAL
  Administered 2016-12-05: 06:00:00 10 mg via ORAL
  Administered 2016-12-05 (×2): 5 mg via ORAL
  Administered 2016-12-05 – 2016-12-06 (×4): 10 mg via ORAL
  Filled 2016-12-04 (×4): qty 2
  Filled 2016-12-04: qty 1
  Filled 2016-12-04 (×2): qty 2
  Filled 2016-12-04 (×3): qty 1
  Filled 2016-12-04: qty 2

## 2016-12-04 MED ORDER — DOCUSATE SODIUM 100 MG PO CAPS
100.0000 mg | ORAL_CAPSULE | Freq: Two times a day (BID) | ORAL | Status: DC
  Administered 2016-12-04 – 2016-12-06 (×4): 100 mg via ORAL
  Filled 2016-12-04 (×4): qty 1

## 2016-12-04 MED ORDER — ONDANSETRON HCL 4 MG/2ML IJ SOLN
INTRAMUSCULAR | Status: DC | PRN
  Administered 2016-12-04: 4 mg via INTRAVENOUS

## 2016-12-04 MED ORDER — ACETAMINOPHEN 10 MG/ML IV SOLN
INTRAVENOUS | Status: DC | PRN
  Administered 2016-12-04: 1000 mg via INTRAVENOUS

## 2016-12-04 MED ORDER — ACETAMINOPHEN 325 MG PO TABS
650.0000 mg | ORAL_TABLET | Freq: Four times a day (QID) | ORAL | Status: DC | PRN
  Administered 2016-12-05: 20:00:00 650 mg via ORAL
  Filled 2016-12-04: qty 2

## 2016-12-04 MED ORDER — BUPIVACAINE IN DEXTROSE 0.75-8.25 % IT SOLN
INTRATHECAL | Status: DC | PRN
  Administered 2016-12-04: 1.6 mL via INTRATHECAL

## 2016-12-04 MED ORDER — POLYETHYLENE GLYCOL 3350 17 G PO PACK: 17.0000 g | PACK | Freq: Every day | ORAL | Status: DC | PRN

## 2016-12-04 MED ORDER — ATORVASTATIN CALCIUM 10 MG PO TABS
5.0000 mg | ORAL_TABLET | Freq: Every day | ORAL | Status: DC
  Administered 2016-12-05 – 2016-12-06 (×2): 5 mg via ORAL
  Filled 2016-12-04 (×3): qty 1

## 2016-12-04 MED ORDER — CEFAZOLIN SODIUM-DEXTROSE 2-4 GM/100ML-% IV SOLN
2.0000 g | INTRAVENOUS | Status: AC
  Administered 2016-12-04: 2 g via INTRAVENOUS

## 2016-12-04 MED ORDER — OXYCODONE HCL 5 MG PO TABS: 5.0000 mg | ORAL_TABLET | Freq: Once | ORAL | Status: DC | PRN

## 2016-12-04 MED ORDER — DEXAMETHASONE SODIUM PHOSPHATE 10 MG/ML IJ SOLN: 10.0000 mg | Freq: Once | INTRAMUSCULAR | Status: DC

## 2016-12-04 MED ORDER — MORPHINE SULFATE (PF) 4 MG/ML IV SOLN: 1.0000 mg | INTRAVENOUS | Status: DC | PRN

## 2016-12-04 MED ORDER — TRANEXAMIC ACID 1000 MG/10ML IV SOLN
1000.0000 mg | INTRAVENOUS | Status: AC
  Administered 2016-12-04: 1000 mg via INTRAVENOUS
  Filled 2016-12-04: qty 1100

## 2016-12-04 MED ORDER — ROPIVACAINE HCL 5 MG/ML IJ SOLN
INTRAMUSCULAR | Status: DC | PRN
  Administered 2016-12-04: 20 mL via PERINEURAL

## 2016-12-04 MED ORDER — PROPOFOL 10 MG/ML IV BOLUS
INTRAVENOUS | Status: AC
  Filled 2016-12-04: qty 40

## 2016-12-04 MED ORDER — HYDROMORPHONE HCL-NACL 0.5-0.9 MG/ML-% IV SOSY: 0.2500 mg | PREFILLED_SYRINGE | INTRAVENOUS | Status: DC | PRN

## 2016-12-04 MED ORDER — BUPIVACAINE LIPOSOME 1.3 % IJ SUSP
INTRAMUSCULAR | Status: DC | PRN
Start: 2016-12-04 — End: 2016-12-04
  Administered 2016-12-04: 20 mL

## 2016-12-04 MED ORDER — ACETAMINOPHEN 10 MG/ML IV SOLN
1000.0000 mg | Freq: Once | INTRAVENOUS | Status: AC
  Administered 2016-12-04: 1000 mg via INTRAVENOUS

## 2016-12-04 MED ORDER — DEXAMETHASONE SODIUM PHOSPHATE 10 MG/ML IJ SOLN
10.0000 mg | Freq: Once | INTRAMUSCULAR | Status: AC
  Administered 2016-12-05: 10 mg via INTRAVENOUS
  Filled 2016-12-04: qty 1

## 2016-12-04 MED ORDER — METHOCARBAMOL 1000 MG/10ML IJ SOLN
500.0000 mg | Freq: Four times a day (QID) | INTRAVENOUS | Status: DC | PRN
  Administered 2016-12-04: 500 mg via INTRAVENOUS
  Filled 2016-12-04: qty 550

## 2016-12-04 MED ORDER — ONDANSETRON HCL 4 MG PO TABS: 4.0000 mg | ORAL_TABLET | Freq: Four times a day (QID) | ORAL | Status: DC | PRN

## 2016-12-04 MED ORDER — ACETAMINOPHEN 10 MG/ML IV SOLN
INTRAVENOUS | Status: AC
  Filled 2016-12-04: qty 100

## 2016-12-04 MED ORDER — GABAPENTIN 300 MG PO CAPS
ORAL_CAPSULE | ORAL | Status: AC
  Filled 2016-12-04: qty 1

## 2016-12-04 MED ORDER — AMLODIPINE BESYLATE 10 MG PO TABS
10.0000 mg | ORAL_TABLET | Freq: Every day | ORAL | Status: DC
  Administered 2016-12-05 – 2016-12-06 (×2): 10 mg via ORAL
  Filled 2016-12-04 (×2): qty 1

## 2016-12-04 MED ORDER — LACTATED RINGERS IV SOLN
INTRAVENOUS | Status: DC
  Administered 2016-12-04: 07:00:00 via INTRAVENOUS

## 2016-12-04 MED ORDER — BUPIVACAINE LIPOSOME 1.3 % IJ SUSP
20.0000 mL | Freq: Once | INTRAMUSCULAR | Status: DC
  Filled 2016-12-04: qty 20

## 2016-12-04 MED ORDER — SODIUM CHLORIDE 0.9 % IV SOLN
1000.0000 mg | Freq: Once | INTRAVENOUS | Status: AC
  Administered 2016-12-04: 13:00:00 1000 mg via INTRAVENOUS
  Filled 2016-12-04: qty 1100

## 2016-12-04 MED ORDER — MENTHOL 3 MG MT LOZG: 1.0000 | LOZENGE | OROMUCOSAL | Status: DC | PRN

## 2016-12-04 MED ORDER — ONDANSETRON HCL 4 MG/2ML IJ SOLN: 4.0000 mg | Freq: Four times a day (QID) | INTRAMUSCULAR | Status: DC | PRN

## 2016-12-04 MED ORDER — OXYCODONE HCL 5 MG/5ML PO SOLN
5.0000 mg | Freq: Once | ORAL | Status: DC | PRN
  Filled 2016-12-04: qty 5

## 2016-12-04 MED ORDER — FENTANYL CITRATE (PF) 100 MCG/2ML IJ SOLN
INTRAMUSCULAR | Status: DC | PRN
  Administered 2016-12-04 (×2): 50 ug via INTRAVENOUS

## 2016-12-04 MED ORDER — ONDANSETRON HCL 4 MG/2ML IJ SOLN
INTRAMUSCULAR | Status: AC
  Filled 2016-12-04: qty 2

## 2016-12-04 MED ORDER — CHLORHEXIDINE GLUCONATE 4 % EX LIQD: 60.0000 mL | Freq: Once | CUTANEOUS | Status: DC

## 2016-12-04 MED ORDER — PROPOFOL 500 MG/50ML IV EMUL
INTRAVENOUS | Status: DC | PRN
  Administered 2016-12-04: 150 ug/kg/min via INTRAVENOUS

## 2016-12-04 MED ORDER — METOCLOPRAMIDE HCL 5 MG/ML IJ SOLN: 5.0000 mg | Freq: Three times a day (TID) | INTRAMUSCULAR | Status: DC | PRN

## 2016-12-04 MED ORDER — METOCLOPRAMIDE HCL 5 MG PO TABS: 5.0000 mg | ORAL_TABLET | Freq: Three times a day (TID) | ORAL | Status: DC | PRN

## 2016-12-04 MED ORDER — LACTATED RINGERS IV SOLN
INTRAVENOUS | Status: DC | PRN
  Administered 2016-12-04 (×2): via INTRAVENOUS

## 2016-12-04 MED ORDER — DEXAMETHASONE SODIUM PHOSPHATE 10 MG/ML IJ SOLN
INTRAMUSCULAR | Status: DC | PRN
  Administered 2016-12-04: 10 mg via INTRAVENOUS

## 2016-12-04 MED ORDER — RIVAROXABAN 10 MG PO TABS
10.0000 mg | ORAL_TABLET | Freq: Every day | ORAL | Status: DC
  Administered 2016-12-05 – 2016-12-06 (×2): 10 mg via ORAL
  Filled 2016-12-04 (×2): qty 1

## 2016-12-04 MED ORDER — CEFAZOLIN SODIUM-DEXTROSE 2-4 GM/100ML-% IV SOLN
INTRAVENOUS | Status: AC
  Filled 2016-12-04: qty 100

## 2016-12-04 MED ORDER — ACETAMINOPHEN 650 MG RE SUPP: 650.0000 mg | Freq: Four times a day (QID) | RECTAL | Status: DC | PRN

## 2016-12-04 MED ORDER — SODIUM CHLORIDE 0.9 % IJ SOLN
INTRAMUSCULAR | Status: AC
  Filled 2016-12-04: qty 50

## 2016-12-04 SURGICAL SUPPLY — 50 items
BAG DECANTER FOR FLEXI CONT (MISCELLANEOUS) ×1 IMPLANT
BAG SPEC THK2 15X12 ZIP CLS (MISCELLANEOUS) ×1
BAG ZIPLOCK 12X15 (MISCELLANEOUS) ×3 IMPLANT
BANDAGE ACE 6X5 VEL STRL LF (GAUZE/BANDAGES/DRESSINGS) ×3 IMPLANT
BLADE SAG 18X100X1.27 (BLADE) ×3 IMPLANT
BLADE SAW SGTL 11.0X1.19X90.0M (BLADE) ×3 IMPLANT
BOWL SMART MIX CTS (DISPOSABLE) ×3 IMPLANT
CAPT KNEE TOTAL 3 ATTUNE ×2 IMPLANT
CEMENT HV SMART SET (Cement) ×6 IMPLANT
CLOSURE WOUND 1/2 X4 (GAUZE/BANDAGES/DRESSINGS) ×1
COVER SURGICAL LIGHT HANDLE (MISCELLANEOUS) ×3 IMPLANT
CUFF TOURN SGL QUICK 34 (TOURNIQUET CUFF) ×3
CUFF TRNQT CYL 34X4X40X1 (TOURNIQUET CUFF) ×1 IMPLANT
DECANTER SPIKE VIAL GLASS SM (MISCELLANEOUS) ×3 IMPLANT
DRAPE U-SHAPE 47X51 STRL (DRAPES) ×3 IMPLANT
DRSG ADAPTIC 3X8 NADH LF (GAUZE/BANDAGES/DRESSINGS) ×3 IMPLANT
DRSG PAD ABDOMINAL 8X10 ST (GAUZE/BANDAGES/DRESSINGS) ×3 IMPLANT
DURAPREP 26ML APPLICATOR (WOUND CARE) ×3 IMPLANT
ELECT REM PT RETURN 15FT ADLT (MISCELLANEOUS) ×3 IMPLANT
EVACUATOR 1/8 PVC DRAIN (DRAIN) ×3 IMPLANT
GAUZE SPONGE 4X4 12PLY STRL (GAUZE/BANDAGES/DRESSINGS) ×3 IMPLANT
GLOVE BIO SURGEON STRL SZ7.5 (GLOVE) IMPLANT
GLOVE BIO SURGEON STRL SZ8 (GLOVE) ×7 IMPLANT
GLOVE BIOGEL PI IND STRL 6.5 (GLOVE) IMPLANT
GLOVE BIOGEL PI IND STRL 8 (GLOVE) ×1 IMPLANT
GLOVE BIOGEL PI INDICATOR 6.5 (GLOVE)
GLOVE BIOGEL PI INDICATOR 8 (GLOVE) ×4
GLOVE SURG SS PI 6.5 STRL IVOR (GLOVE) IMPLANT
GOWN STRL REUS W/TWL LRG LVL3 (GOWN DISPOSABLE) ×7 IMPLANT
GOWN STRL REUS W/TWL XL LVL3 (GOWN DISPOSABLE) ×4 IMPLANT
HANDPIECE INTERPULSE COAX TIP (DISPOSABLE) ×3
IMMOBILIZER KNEE 20 (SOFTGOODS) ×3
IMMOBILIZER KNEE 20 THIGH 36 (SOFTGOODS) ×1 IMPLANT
MANIFOLD NEPTUNE II (INSTRUMENTS) ×3 IMPLANT
NS IRRIG 1000ML POUR BTL (IV SOLUTION) ×3 IMPLANT
PACK TOTAL KNEE CUSTOM (KITS) ×3 IMPLANT
PADDING CAST COTTON 6X4 STRL (CAST SUPPLIES) ×5 IMPLANT
POSITIONER SURGICAL ARM (MISCELLANEOUS) ×3 IMPLANT
SET HNDPC FAN SPRY TIP SCT (DISPOSABLE) ×1 IMPLANT
STRIP CLOSURE SKIN 1/2X4 (GAUZE/BANDAGES/DRESSINGS) ×3 IMPLANT
SUT MNCRL AB 4-0 PS2 18 (SUTURE) ×3 IMPLANT
SUT STRATAFIX 0 PDS 27 VIOLET (SUTURE) ×3
SUT VIC AB 2-0 CT1 27 (SUTURE) ×9
SUT VIC AB 2-0 CT1 TAPERPNT 27 (SUTURE) ×3 IMPLANT
SUTURE STRATFX 0 PDS 27 VIOLET (SUTURE) ×1 IMPLANT
SYR 30ML LL (SYRINGE) ×6 IMPLANT
TRAY FOLEY W/METER SILVER 16FR (SET/KITS/TRAYS/PACK) ×3 IMPLANT
WATER STERILE IRR 1000ML POUR (IV SOLUTION) ×6 IMPLANT
WRAP KNEE MAXI GEL POST OP (GAUZE/BANDAGES/DRESSINGS) ×3 IMPLANT
YANKAUER SUCT BULB TIP 10FT TU (MISCELLANEOUS) ×3 IMPLANT

## 2016-12-04 NOTE — Anesthesia Preprocedure Evaluation (Signed)
Anesthesia Evaluation  Patient identified by MRN, date of birth, ID band Patient awake    Reviewed: Allergy & Precautions, H&P , NPO status , Patient's Chart, lab work & pertinent test results  History of Anesthesia Complications (+) PONV and history of anesthetic complications  Airway Mallampati: II   Neck ROM: full    Dental   Pulmonary neg pulmonary ROS,    breath sounds clear to auscultation       Cardiovascular hypertension,  Rhythm:regular Rate:Normal     Neuro/Psych    GI/Hepatic   Endo/Other    Renal/GU      Musculoskeletal  (+) Arthritis ,   Abdominal   Peds  Hematology   Anesthesia Other Findings   Reproductive/Obstetrics                             Anesthesia Physical Anesthesia Plan  ASA: II  Anesthesia Plan: Spinal   Post-op Pain Management:  Regional for Post-op pain   Induction: Intravenous  PONV Risk Score and Plan: 3 and Ondansetron, Dexamethasone, Propofol infusion, Treatment may vary due to age or medical condition and Midazolam  Airway Management Planned: Simple Face Mask  Additional Equipment:   Intra-op Plan:   Post-operative Plan:   Informed Consent: I have reviewed the patients History and Physical, chart, labs and discussed the procedure including the risks, benefits and alternatives for the proposed anesthesia with the patient or authorized representative who has indicated his/her understanding and acceptance.     Plan Discussed with: CRNA, Anesthesiologist and Surgeon  Anesthesia Plan Comments:         Anesthesia Quick Evaluation

## 2016-12-04 NOTE — Transfer of Care (Signed)
Immediate Anesthesia Transfer of Care Note  Patient: Krystal Kent  Procedure(s) Performed: RIGHT TOTAL KNEE ARTHROPLASTY (Right Knee)  Patient Location: PACU  Anesthesia Type:Spinal  Level of Consciousness: awake, oriented, drowsy, patient cooperative and responds to stimulation  Airway & Oxygen Therapy: Patient Spontanous Breathing and Patient connected to face mask oxygen  Post-op Assessment: Report given to RN and Post -op Vital signs reviewed and stable  Post vital signs: Reviewed and stable  Last Vitals:  Vitals:   12/04/16 0634 12/04/16 0952  BP: (!) 153/85   Pulse: 79 71  Resp: 16 14  Temp: 36.8 C   SpO2: 100% 100%    Last Pain:  Vitals:   12/04/16 0634  TempSrc: Oral         Complications: No apparent anesthesia complications

## 2016-12-04 NOTE — Progress Notes (Signed)
Orthopedic Tech Progress Note Patient Details:  Krystal Kent 1945/07/04 [id number]  CPM Right Knee CPM Right Knee: Off Right Knee Flexion (Degrees): 40 Right Knee Extension (Degrees): 10   [name] 12/04/2016, 2:42 PM

## 2016-12-04 NOTE — Anesthesia Procedure Notes (Signed)
Anesthesia Regional Block: Adductor canal block   Pre-Anesthetic Checklist: ,, timeout performed, Correct Patient, Correct Site, Correct Laterality, Correct Procedure, Correct Position, site marked, Risks and benefits discussed,  Surgical consent,  Pre-op evaluation,  At surgeon's request and post-op pain management  Laterality: Right  Prep: chloraprep       Needles:  Injection technique: Single-shot  Needle Type: Echogenic Needle     Needle Length: 9cm  Needle Gauge: 21     Additional Needles:   Narrative:  Start time: 12/04/2016 7:42 AM End time: 12/04/2016 7:51 AM Injection made incrementally with aspirations every 5 mL.  Performed by: Personally  Anesthesiologist: Charmeka Freeburg  Additional Notes: Pt tolerated the procedure well.

## 2016-12-04 NOTE — Evaluation (Signed)
Physical Therapy Evaluation Patient Details Name: Krystal Kent MRN: [id number] DOB: 04-Oct-1945 Today's Date: 12/04/2016   History of Present Illness  RTKA  Clinical Impression  The patient was feeling well, gradual onset of nausea after starting to ambulate. Assisted to recliner after 10' Pt admitted with above diagnosis. Pt currently with functional limitations due to the deficits listed below (see PT Problem List). Pt will benefit from skilled PT to increase their independence and safety with mobility to allow discharge to the venue listed below.       Follow Up Recommendations DC plan and follow up therapy as arranged by surgeon;Outpatient PT 10/12    Equipment Recommendations  None recommended by PT    Recommendations for Other Services       Precautions / Restrictions Precautions Precautions: Knee;Fall Required Braces or Orthoses: Knee Immobilizer - Right Knee Immobilizer - Right: Discontinue once straight leg raise with < 10 degree lag      Mobility  Bed Mobility Overal bed mobility: Needs Assistance Bed Mobility: Supine to Sit     Supine to sit: Min assist     General bed mobility comments: support right leg, assist with  trunk  Transfers Overall transfer level: Needs assistance Equipment used: Rolling walker (2 wheeled) Transfers: Sit to/from Stand Sit to Stand: Min assist         General transfer comment: cues  for hand and right leg position  Ambulation/Gait Ambulation/Gait assistance: Min assist;+2 safety/equipment Ambulation Distance (Feet): 10 Feet Assistive device: Rolling walker (2 wheeled) Gait Pattern/deviations: Step-to pattern;Antalgic     General Gait Details: progressive nausea, + emesis after sitting down. BP 104/90.   Stairs            Wheelchair Mobility    Modified Rankin (Stroke Patients Only)       Balance                                             Pertinent Vitals/Pain Pain Assessment:  0-10 Pain Score: 3  Pain Location: right knee Pain Descriptors / Indicators: Discomfort Pain Intervention(s): Limited activity within patient's tolerance;Monitored during session;Premedicated before session;Repositioned;Ice applied    Home Living Family/patient expects to be discharged to:: Private residence Living Arrangements: Spouse/significant other Available Help at Discharge: Family Type of Home: House Home Access: Stairs to enter Entrance Stairs-Rails: Psychiatric nurse of Steps: 3 Home Layout: Bed/bath upstairs Home Equipment: Kasandra Knudsen - single point      Prior Function Level of Independence: Independent               Hand Dominance        Extremity/Trunk Assessment   Upper Extremity Assessment Upper Extremity Assessment: Defer to OT evaluation    Lower Extremity Assessment Lower Extremity Assessment: RLE deficits/detail RLE Deficits / Details: SLR with min assist    Cervical / Trunk Assessment Cervical / Trunk Assessment: Normal  Communication   Communication: No difficulties  Cognition Arousal/Alertness: Awake/alert Behavior During Therapy: WFL for tasks assessed/performed                                          General Comments      Exercises     Assessment/Plan    PT Assessment Patient needs continued PT services  PT  Problem List Decreased strength;Decreased range of motion;Decreased activity tolerance;Decreased knowledge of precautions;Decreased mobility;Decreased safety awareness;Decreased knowledge of use of DME;Pain       PT Treatment Interventions DME instruction;Gait training;Stair training;Functional mobility training;Therapeutic activities;Therapeutic exercise;Patient/family education    PT Goals (Current goals can be found in the Care Plan section)  Acute Rehab PT Goals Patient Stated Goal: to walk PT Goal Formulation: With patient/family Time For Goal Achievement: 12/09/16 Potential to Achieve  Goals: Good    Frequency 7X/week   Barriers to discharge        Co-evaluation               AM-PAC PT "6 Clicks" Daily Activity  Outcome Measure Difficulty turning over in bed (including adjusting bedclothes, sheets and blankets)?: Unable Difficulty moving from lying on back to sitting on the side of the bed? : Unable Difficulty sitting down on and standing up from a chair with arms (e.g., wheelchair, bedside commode, etc,.)?: Unable Help needed moving to and from a bed to chair (including a wheelchair)?: Total Help needed walking in hospital room?: Total Help needed climbing 3-5 steps with a railing? : Total 6 Click Score: 6    End of Session Equipment Utilized During Treatment: Gait belt;Right knee immobilizer Activity Tolerance: Treatment limited secondary to medical complications (Comment) Patient left: in chair;with family/visitor present;with call bell/phone within reach Nurse Communication: Mobility status PT Visit Diagnosis: Difficulty in walking, not elsewhere classified (R26.2);Pain Pain - Right/Left: Right Pain - part of body: Knee    Time: 1425-1457 PT Time Calculation (min) (ACUTE ONLY): 32 min   Charges:   PT Evaluation $PT Eval Low Complexity: 1 Low PT Treatments $Gait Training: 8-22 mins   PT G CodesTresa Endo PT [phone]   Claretha Cooper 12/04/2016, 4:17 PM

## 2016-12-04 NOTE — H&P (View-Only) (Signed)
Krystal Kent DOB: [birth year] Married / Language: English / Race: White Female Date of Admission:  12/04/2016 CC:  Right Knee pain History of Present Illness  The patient is a 71 year old female who comes in for a preoperative History and Physical. The patient is scheduled for a right total knee arthroplasty to be performed by Dr. Dione Plover. Aluisio, MD at [facility] on 12/04/2016. The patient is a 71 year old female who presented for follow up of their knee. The patient is being followed for their right knee pain and osteoarthritis. They are now 3 year(s) out from last evaluation in our office. Symptoms reported include: pain, aching, stiffness, popping, instability, difficulty ambulating and difficulty arising from chair. The patient feels that they are doing poorly. The following medication has been used for pain control: Tylenol. She is 9 years out from left total knee, done by Dr. Onnie Graham. She states the left knee is doing well.The RIGHT knee is now hurting her at all times. Is limiting what she can and cannot do. For the past several months she has barely been getting around. She has documented end-stage arthritis of the RIGHT knee from x-rays 3 years ago. Her symptoms have gotten progressively worse now. She is ready to proceed with the total knee arthroplasty on the right knee at this time. They have been treated conservatively in the past for the above stated problem and despite conservative measures, they continue to have progressive pain and severe functional limitations and dysfunction. They have failed non-operative management including home exercise, medications, and injections. It is felt that they would benefit from undergoing total joint replacement. Risks and benefits of the procedure have been discussed with the patient and they elect to proceed with surgery. There are no active contraindications to surgery such as ongoing infection or rapidly progressive neurological  disease.   Problem List/Past Medical Primary osteoarthritis of left knee (M17.12)  S/P total knee arthroplasty, left (J88.416)  High blood pressure  Hypercholesterolemia  Hypothyroidism  Osteoarthritis   Allergies  Demerol *ANALGESICS - OPIOID*  Nausea, Vomiting.  Family History Cancer  Father, Sister. Hypertension  Brother, Father, Mother, Sister. mother and father Rheumatoid Arthritis  Mother. mother  Social History Alcohol use  current drinker; only occasionally per week Children  2 Current drinker  10/21/2013: Currently drinks only occasionally per week Copy of Drug/Alcohol Rehab (Previously)  no Current work status  retired Furniture conservator/restorer daily; does running / walking and gym / [employer] Drug/Alcohol Rehab (Currently)  no Living situation  live with spouse Marital status  married No history of drug/alcohol rehab  Illicit drug use  no Not under pain contract  Number of flights of stairs before winded  greater than 5 Pain Contract  no Tobacco use  Never smoker. 10/21/2013 never smoker Tobacco / smoke exposure  no Post-Surgical Plans  Home With Family, Home, Straight to Outpatient Therapy. Advance Directives  Living Will, Healthcare Power of [place].  Medication History Glucosamine Chondroitin Adv (Oral) Active. Calcium (Oral) Specific strength unknown - Active. Multivitamin Adult (Oral) Active. Levothyroxine Sodium (75MCG Tablet, Oral) Active. Fenofibrate (48MG  Tablet, Oral) Active. Triamterene-HCTZ (37.5-25MG  Tablet, Oral) Active. Atorvastatin Calcium (20MG  Tablet, Oral) Active. AmLODIPine Besylate (10MG  Tablet, Oral) Active.  Past Surgical History Appendectomy  Gallbladder Surgery  open and laporoscopic Hysterectomy  complete (non-cancerous) Total Knee Replacement  left   Review of Systems General Not Present- Chills, Fatigue, Fever, Memory Loss, Night Sweats, Weight Gain and Weight Loss. Skin Not Present-  Eczema,  Hives, Itching, Lesions and Rash. HEENT Not Present- Dentures, Double Vision, Headache, Hearing Loss, Tinnitus and Visual Loss. Respiratory Present- Allergies. Not Present- Chronic Cough, Coughing up blood, Shortness of breath at rest and Shortness of breath with exertion. Cardiovascular Present- Hypertension. Not Present- Chest Pain, Difficulty Breathing Lying Down, Murmur, Palpitations, Racing/skipping heartbeats and Swelling. Gastrointestinal Not Present- Abdominal Pain, Bloody Stool, Constipation, Diarrhea, Difficulty Swallowing, Heartburn, Jaundice, Loss of appetitie, Nausea and Vomiting. Female Genitourinary Not Present- Blood in Urine, Discharge, Flank Pain, Incontinence, Painful Urination, Urgency, Urinary frequency, Urinary Retention, Urinating at Night and Weak urinary stream. Musculoskeletal Present- Joint Stiffness. Not Present- Back Pain, Joint Pain, Joint Swelling, Morning Stiffness, Muscle Pain, Muscle Weakness and Spasms. Neurological Not Present- Blackout spells, Difficulty with balance, Dizziness, Paralysis, Tremor and Weakness. Psychiatric Not Present- Insomnia. Endocrine Present- Thyroid Problems.  Vitals Weight: 166 lb Height: 62in Weight was reported by patient. Height was reported by patient. Body Surface Area: 1.77 m Body Mass Index: 30.36 kg/m  Pulse: 78 (Regular)  BP: 132/80 (Sitting, Right Arm, Standard)  Physical Exam General Mental Status -Alert, cooperative and good historian. General Appearance-pleasant, Not in acute distress. Orientation-Oriented X3. Build & Nutrition-Well nourished and Well developed.  Head and Neck Head-normocephalic, atraumatic . Neck Global Assessment - supple, no bruit auscultated on the right, no bruit auscultated on the left.  Eye Pupil - Bilateral-Regular and Round. Motion - Bilateral-EOMI.  Chest and Lung Exam Auscultation Breath sounds - clear at anterior chest wall and clear at posterior  chest wall. Adventitious sounds - No Adventitious sounds.  Cardiovascular Auscultation Rhythm - Regular rate and rhythm. Heart Sounds - S1 WNL and S2 WNL. Murmurs & Other Heart Sounds - Auscultation of the heart reveals - No Murmurs.  Abdomen Palpation/Percussion Tenderness - Abdomen is non-tender to palpation. Rigidity (guarding) - Abdomen is soft. Auscultation Auscultation of the abdomen reveals - Bowel sounds normal.  Female Genitourinary Note: Not done, not pertinent to present illness   Musculoskeletal Note: Evaluation of the left hip shows flexion to 120 rotation in 30 out 40 and abduction 40 without discomfort. There is no tenderness over the greater trochanter. There is no pain on provocative testing of the hip.Examination of the right hip shows flexion to 120 rotation in 30 abduction 40 and external rotation of 40. There is no tenderness over the greater trochanter. There is no pain on provocative testing of the hip. Her LEFT knee shows no effusion. Her range of motion is 0-110. There is no instability. Her RIGHT knee shows no effusion range of motion is 10-105. She has tenderness over the medial and lateral joint lines. There is marked crepitus on range of motion. There is no instability. Gait pattern is significantly antalgic on the RIGHT.  Radiographs-AP and lateral of the RIGHT knee show tricompartmental end-stage arthritis bone-on-bone in all 3 compartments. We also had AP of her LEFT knee showing a [employer] prosthesis in good position with no periprosthetic abnormalities.   Assessment & Plan  S/P total knee arthroplasty, left (V78.588)  Primary osteoarthritis of one knee, right (M17.11)  Note:Surgical Plans: Right Total Knee Replacement  Disposition: Home with assistance, Patient instructed to schedule therapy at [facility] on Friday Oct. 12th  PCP: Dr. Earle Gell - Patient has been seen preoperatively and felt to be stable for surgery.  "low risk for cardiovascular complications with surgery".  IV TXA  Anesthesia Issues: None except for nausea  Patient was instructed on what medications to stop prior to surgery.  Preop Lab with PCP Hgb A1C - 6.4  Signed electronically by Joelene Millin, III PA-C

## 2016-12-04 NOTE — Progress Notes (Signed)
Orthopedic Tech Progress Note Patient Details:  Krystal Kent [birth year] [id number]  CPM Right Knee CPM Right Knee: On Right Knee Flexion (Degrees): 40 Right Knee Extension (Degrees): 10   [name] 12/04/2016, 12:28 PM

## 2016-12-04 NOTE — Op Note (Signed)
OPERATIVE REPORT-TOTAL KNEE ARTHROPLASTY   Pre-operative diagnosis- Osteoarthritis  Right knee(s)  Post-operative diagnosis- Osteoarthritis Right knee(s)  Procedure-  Right  Total Knee Arthroplasty  Surgeon- Dione Plover. Bethanne Mule, MD  Assistant- Arlee Muslim, PA-C   Anesthesia-  Adductor canal block and spinal  EBL-* No blood loss amount entered *   Drains Hemovac  Tourniquet time-  Total Tourniquet Time Documented: Thigh (Right) - 33 minutes Total: Thigh (Right) - 33 minutes     Complications- None  Condition-PACU - hemodynamically stable.   Brief Clinical Note  Krystal Kent is a 71 y.o. year old female with end stage OA of her right knee with progressively worsening pain and dysfunction. She has constant pain, with activity and at rest and significant functional deficits with difficulties even with ADLs. She has had extensive non-op management including analgesics, injections of cortisone and viscosupplements, and home exercise program, but remains in significant pain with significant dysfunction.Radiographs show bone on bone arthritis medial and patellofemoral. She presents now for right Total Knee Arthroplasty.    Procedure in detail---   The patient is brought into the operating room and positioned supine on the operating table. After successful administration of  Adductor canal block and spinal,   a tourniquet is placed high on the  Right thigh(s) and the lower extremity is prepped and draped in the usual sterile fashion. Time out is performed by the operating team and then the  Right lower extremity is wrapped in Esmarch, knee flexed and the tourniquet inflated to 300 mmHg.       A midline incision is made with a ten blade through the subcutaneous tissue to the level of the extensor mechanism. A fresh blade is used to make a medial parapatellar arthrotomy. Soft tissue over the proximal medial tibia is subperiosteally elevated to the joint line with a knife and into the  semimembranosus bursa with a Cobb elevator. Soft tissue over the proximal lateral tibia is elevated with attention being paid to avoiding the patellar tendon on the tibial tubercle. The patella is everted, knee flexed 90 degrees and the ACL and PCL are removed. Findings are bone on bone medial and patellofemoral with large global osteophytes.        The drill is used to create a starting hole in the distal femur and the canal is thoroughly irrigated with sterile saline to remove the fatty contents. The 5 degree Right  valgus alignment guide is placed into the femoral canal and the distal femoral cutting block is pinned to remove 9 mm off the distal femur. Resection is made with an oscillating saw.      The tibia is subluxed forward and the menisci are removed. The extramedullary alignment guide is placed referencing proximally at the medial aspect of the tibial tubercle and distally along the second metatarsal axis and tibial crest. The block is pinned to remove 30mm off the more deficient medial  side. Resection is made with an oscillating saw. Size 4is the most appropriate size for the tibia and the proximal tibia is prepared with the modular drill and keel punch for that size.      The femoral sizing guide is placed and size 4 is most appropriate. Rotation is marked off the epicondylar axis and confirmed by creating a rectangular flexion gap at 90 degrees. The size 4 cutting block is pinned in this rotation and the anterior, posterior and chamfer cuts are made with the oscillating saw. The intercondylar block is then placed and that  cut is made.      Trial size 4 tibial component, trial size 4 posterior stabilized femur and a 8  mm posterior stabilized rotating platform insert trial is placed. Full extension is achieved with excellent varus/valgus and anterior/posterior balance throughout full range of motion. The patella is everted and thickness measured to be 22  mm. Free hand resection is taken to 12 mm, a  35 template is placed, lug holes are drilled, trial patella is placed, and it tracks normally. Osteophytes are removed off the posterior femur with the trial in place. All trials are removed and the cut bone surfaces prepared with pulsatile lavage. Cement is mixed and once ready for implantation, the size 4 tibial implant, size  4 posterior stabilized femoral component, and the size 35 patella are cemented in place and the patella is held with the clamp. The trial insert is placed and the knee held in full extension. The Exparel (20 ml mixed with 60 ml saline) is injected into the extensor mechanism, posterior capsule, medial and lateral gutters and subcutaneous tissues.  All extruded cement is removed and once the cement is hard the permanent 8 mm posterior stabilized rotating platform insert is placed into the tibial tray.      The wound is copiously irrigated with saline solution and the extensor mechanism closed over a hemovac drain with #1 V-loc suture. The tourniquet is released for a total tourniquet time of 33  minutes. Flexion against gravity is 140 degrees and the patella tracks normally. Subcutaneous tissue is closed with 2.0 vicryl and subcuticular with running 4.0 Monocryl. The incision is cleaned and dried and steri-strips and a bulky sterile dressing are applied. The limb is placed into a knee immobilizer and the patient is awakened and transported to recovery in stable condition.      Please note that a surgical assistant was a medical necessity for this procedure in order to perform it in a safe and expeditious manner. Surgical assistant was necessary to retract the ligaments and vital neurovascular structures to prevent injury to them and also necessary for proper positioning of the limb to allow for anatomic placement of the prosthesis.   Dione Plover Iniya Matzek, MD    12/04/2016, 9:20 AM

## 2016-12-04 NOTE — Discharge Instructions (Addendum)
° °Dr. Frank Aluisio °Total Joint Specialist °[facility] °[address] °[place], [place] [address] °[phone] ° °TOTAL KNEE REPLACEMENT POSTOPERATIVE DIRECTIONS ° °Knee Rehabilitation, Guidelines Following Surgery  °Results after knee surgery are often greatly improved when you follow the exercise, range of motion and muscle strengthening exercises prescribed by your doctor. Safety measures are also important to protect the knee from further injury. Any time any of these exercises cause you to have increased pain or swelling in your knee joint, decrease the amount until you are comfortable again and slowly increase them. If you have problems or questions, call your caregiver or physical therapist for advice.  ° °HOME CARE INSTRUCTIONS  °Remove items at home which could result in a fall. This includes throw rugs or furniture in walking pathways.  °· ICE to the affected knee every three hours for 30 minutes at a time and then as needed for pain and swelling.  Continue to use ice on the knee for pain and swelling from surgery. You may notice swelling that will progress down to the foot and ankle.  This is normal after surgery.  Elevate the leg when you are not up walking on it.   °· Continue to use the breathing machine which will help keep your temperature down.  It is common for your temperature to cycle up and down following surgery, especially at night when you are not up moving around and exerting yourself.  The breathing machine keeps your lungs expanded and your temperature down. °· Do not place pillow under knee, focus on keeping the knee straight while resting ° °DIET °You may resume your previous home diet once your are discharged from the hospital. ° °DRESSING / WOUND CARE / SHOWERING °You may shower 3 days after surgery, but keep the wounds dry during showering.  You may use an occlusive plastic wrap (Press'n Seal for example), NO SOAKING/SUBMERGING IN THE BATHTUB.  If the  bandage gets wet, change with a clean dry gauze.  If the incision gets wet, pat the wound dry with a clean towel. °You may start showering once you are discharged home but do not submerge the incision under water. Just pat the incision dry and apply a dry gauze dressing on daily. °Change the surgical dressing daily and reapply a dry dressing each time. ° °ACTIVITY °Walk with your walker as instructed. °Use walker as long as suggested by your caregivers. °Avoid periods of inactivity such as sitting longer than an hour when not asleep. This helps prevent blood clots.  °You may resume a sexual relationship in one month or when given the OK by your doctor.  °You may return to work once you are cleared by your doctor.  °Do not drive a car for 6 weeks or until released by you surgeon.  °Do not drive while taking narcotics. ° °WEIGHT BEARING °Weight bearing as tolerated with assist device (walker, cane, etc) as directed, use it as long as suggested by your surgeon or therapist, typically at least 4-6 weeks. ° °POSTOPERATIVE CONSTIPATION PROTOCOL °Constipation - defined medically as fewer than three stools per week and severe constipation as less than one stool per week. ° °One of the most common issues patients have following surgery is constipation.  Even if you have a regular bowel pattern at home, your normal regimen is likely to be disrupted due to multiple reasons following surgery.  Combination of anesthesia, postoperative narcotics, change in appetite and fluid intake all can affect your bowels.    In order to avoid complications following surgery, here are some recommendations in order to help you during your recovery period. ° °Colace (docusate) - Pick up an over-the-counter form of Colace or another stool softener and take twice a day as long as you are requiring postoperative pain medications.  Take with a full glass of water daily.  If you experience loose stools or diarrhea, hold the colace until you stool forms  back up.  If your symptoms do not get better within 1 week or if they get worse, check with your doctor. ° °Dulcolax (bisacodyl) - Pick up over-the-counter and take as directed by the product packaging as needed to assist with the movement of your bowels.  Take with a full glass of water.  Use this product as needed if not relieved by Colace only.  ° °MiraLax (polyethylene glycol) - Pick up over-the-counter to have on hand.  MiraLax is a solution that will increase the amount of water in your bowels to assist with bowel movements.  Take as directed and can mix with a glass of water, juice, soda, coffee, or tea.  Take if you go more than two days without a movement. °Do not use MiraLax more than once per day. Call your doctor if you are still constipated or irregular after using this medication for 7 days in a row. ° °If you continue to have problems with postoperative constipation, please contact the office for further assistance and recommendations.  If you experience "the worst abdominal pain ever" or develop nausea or vomiting, please contact the office immediatly for further recommendations for treatment. ° °ITCHING ° If you experience itching with your medications, try taking only a single pain pill, or even half a pain pill at a time.  You can also use Benadryl over the counter for itching or also to help with sleep.  ° °TED HOSE STOCKINGS °Wear the elastic stockings on both legs for three weeks following surgery during the day but you may remove then at night for sleeping. ° °MEDICATIONS °See your medication summary on the “After Visit Summary” that the nursing staff will review with you prior to discharge.  You may have some home medications which will be placed on hold until you complete the course of blood thinner medication.  It is important for you to complete the blood thinner medication as prescribed by your surgeon.  Continue your approved medications as instructed at time of  discharge. ° °PRECAUTIONS °If you experience chest pain or shortness of breath - call 911 immediately for transfer to the hospital emergency department.  °If you develop a fever greater that 101 F, purulent drainage from wound, increased redness or drainage from wound, foul odor from the wound/dressing, or calf pain - CONTACT YOUR SURGEON.   °                                                °FOLLOW-UP APPOINTMENTS °Make sure you keep all of your appointments after your operation with your surgeon and caregivers. You should call the office at the above phone number and make an appointment for approximately two weeks after the date of your surgery or on the date instructed by your surgeon outlined in the "After Visit Summary". ° ° °RANGE OF MOTION AND STRENGTHENING EXERCISES  °Rehabilitation of the knee is important following a knee injury or   an operation. After just a few days of immobilization, the muscles of the thigh which control the knee become weakened and shrink (atrophy). Knee exercises are designed to build up the tone and strength of the thigh muscles and to improve knee motion. Often times heat used for twenty to thirty minutes before working out will loosen up your tissues and help with improving the range of motion but do not use heat for the first two weeks following surgery. These exercises can be done on a training (exercise) mat, on the floor, on a table or on a bed. Use what ever works the best and is most comfortable for you Knee exercises include:  °Leg Lifts - While your knee is still immobilized in a splint or cast, you can do straight leg raises. Lift the leg to 60 degrees, hold for 3 sec, and slowly lower the leg. Repeat 10-20 times 2-3 times daily. Perform this exercise against resistance later as your knee gets better.  °Quad and Hamstring Sets - Tighten up the muscle on the front of the thigh (Quad) and hold for 5-10 sec. Repeat this 10-20 times hourly. Hamstring sets are done by pushing the  foot backward against an object and holding for 5-10 sec. Repeat as with quad sets.  °· Leg Slides: Lying on your back, slowly slide your foot toward your buttocks, bending your knee up off the floor (only go as far as is comfortable). Then slowly slide your foot back down until your leg is flat on the floor again. °· Angel Wings: Lying on your back spread your legs to the side as far apart as you can without causing discomfort.  °A rehabilitation program following serious knee injuries can speed recovery and prevent re-injury in the future due to weakened muscles. Contact your doctor or a physical therapist for more information on knee rehabilitation.  ° °IF YOU ARE TRANSFERRED TO A SKILLED REHAB FACILITY °If the patient is transferred to a skilled rehab facility following release from the hospital, a list of the current medications will be sent to the facility for the patient to continue.  When discharged from the skilled rehab facility, please have the facility set up the patient's Home Health Physical Therapy prior to being released. Also, the skilled facility will be responsible for providing the patient with their medications at time of release from the facility to include their pain medication, the muscle relaxants, and their blood thinner medication. If the patient is still at the rehab facility at time of the two week follow up appointment, the skilled rehab facility will also need to assist the patient in arranging follow up appointment in our office and any transportation needs. ° °MAKE SURE YOU:  °Understand these instructions.  °Get help right away if you are not doing well or get worse.  ° ° °Pick up stool softner and laxative for home use following surgery while on pain medications. °Do not submerge incision under water. °Please use good hand washing techniques while changing dressing each day. °May shower starting three days after surgery. °Please use a clean towel to pat the incision dry following  showers. °Continue to use ice for pain and swelling after surgery. °Do not use any lotions or creams on the incision until instructed by your surgeon. ° °Take Xarelto for two and a half more weeks following discharge from the hospital, then discontinue Xarelto. °Once the patient has completed the Xarelto, they may resume the 81 mg Aspirin. ° ° ° °Information on   my medicine - XARELTO® (Rivaroxaban) ° °Why was Xarelto® prescribed for you? °Xarelto® was prescribed for you to reduce the risk of blood clots forming after orthopedic surgery. The medical term for these abnormal blood clots is venous thromboembolism (VTE). ° °What do you need to know about xarelto® ? °Take your Xarelto® ONCE DAILY at the same time every day. °You may take it either with or without food. ° °If you have difficulty swallowing the tablet whole, you may crush it and mix in applesauce just prior to taking your dose. ° °Take Xarelto® exactly as prescribed by your doctor and DO NOT stop taking Xarelto® without talking to the doctor who prescribed the medication.  Stopping without other VTE prevention medication to take the place of Xarelto® may increase your risk of developing a clot. ° °After discharge, you should have regular check-up appointments with your healthcare provider that is prescribing your Xarelto®.   ° °What do you do if you miss a dose? °If you miss a dose, take it as soon as you remember on the same day then continue your regularly scheduled once daily regimen the next day. Do not take two doses of Xarelto® on the same day.  ° °Important Safety Information °A possible side effect of Xarelto® is bleeding. You should call your healthcare provider right away if you experience any of the following: °? Bleeding from an injury or your nose that does not stop. °? Unusual colored urine (red or dark brown) or unusual colored stools (red or black). °? Unusual bruising for unknown reasons. °? A serious fall or if you hit your head (even if  there is no bleeding). ° °Some medicines may interact with Xarelto® and might increase your risk of bleeding while on Xarelto®. To help avoid this, consult your healthcare provider or pharmacist prior to using any new prescription or non-prescription medications, including herbals, vitamins, non-steroidal anti-inflammatory drugs (NSAIDs) and supplements. ° °This website has more information on Xarelto®: www.xarelto.com. ° ° ° °

## 2016-12-04 NOTE — Anesthesia Postprocedure Evaluation (Signed)
Anesthesia Post Note  Patient: KATTY FRETWELL  Procedure(s) Performed: RIGHT TOTAL KNEE ARTHROPLASTY (Right Knee)     Patient location during evaluation: PACU Anesthesia Type: Spinal Level of consciousness: oriented and awake and alert Pain management: pain level controlled Vital Signs Assessment: post-procedure vital signs reviewed and stable Respiratory status: spontaneous breathing, respiratory function stable and patient connected to nasal cannula oxygen Cardiovascular status: blood pressure returned to baseline and stable Postop Assessment: no headache, no backache and no apparent nausea or vomiting Anesthetic complications: no    Last Vitals:  Vitals:   12/04/16 1226 12/04/16 1332  BP: 121/66 121/62  Pulse: 70 (!) 58  Resp: 16 16  Temp: (!) 36.3 C (!) 36.3 C  SpO2: 100% 100%    Last Pain:  Vitals:   12/04/16 1332  TempSrc: Oral  PainSc:                  Eljay Lave S

## 2016-12-04 NOTE — Interval H&P Note (Signed)
History and Physical Interval Note:  12/04/2016 6:45 AM  Krystal Kent  has presented today for surgery, with the diagnosis of Osteoarthritis Right Knee  The various methods of treatment have been discussed with the patient and family. After consideration of risks, benefits and other options for treatment, the patient has consented to  Procedure(s): RIGHT TOTAL KNEE ARTHROPLASTY (Right) as a surgical intervention .  The patient's history has been reviewed, patient examined, no change in status, stable for surgery.  I have reviewed the patient's chart and labs.  Questions were answered to the patient's satisfaction.     [name]

## 2016-12-04 NOTE — Anesthesia Procedure Notes (Signed)
Spinal  Patient location during procedure: OR Start time: 12/04/2016 8:10 AM End time: 12/04/2016 8:16 AM Staffing Anesthesiologist: Marcie Bal, Myrna Vonseggern Performed: anesthesiologist  Preanesthetic Checklist Completed: patient identified, surgical consent, pre-op evaluation, timeout performed, IV checked, risks and benefits discussed and monitors and equipment checked Spinal Block Patient position: sitting Prep: DuraPrep Patient monitoring: cardiac monitor, continuous pulse ox and blood pressure Approach: left paramedian Location: L3-4 Injection technique: single-shot Needle Needle type: Quincke  Needle gauge: 22 G Needle length: 9 cm Assessment Sensory level: T10 Additional Notes Functioning IV was confirmed and monitors were applied. Sterile prep and drape, including hand hygiene and sterile gloves were used. The patient was positioned and the spine was prepped. The skin was anesthetized with lidocaine.  Free flow of clear CSF was obtained prior to injecting local anesthetic into the CSF.  The spinal needle aspirated freely following injection.  The needle was carefully withdrawn.  The patient tolerated the procedure well.

## 2016-12-05 ENCOUNTER — Encounter (HOSPITAL_COMMUNITY): Payer: Self-pay | Admitting: Orthopedic Surgery

## 2016-12-05 LAB — CBC
HCT: 33.1 % — ABNORMAL LOW (ref 36.0–46.0)
HEMOGLOBIN: 11.1 g/dL — AB (ref 12.0–15.0)
MCH: 31.4 pg (ref 26.0–34.0)
MCHC: 33.5 g/dL (ref 30.0–36.0)
MCV: 93.5 fL (ref 78.0–100.0)
PLATELETS: 193 10*3/uL (ref 150–400)
RBC: 3.54 MIL/uL — AB (ref 3.87–5.11)
RDW: 13.4 % (ref 11.5–15.5)
WBC: 11.7 10*3/uL — AB (ref 4.0–10.5)

## 2016-12-05 LAB — BASIC METABOLIC PANEL
ANION GAP: 9 (ref 5–15)
BUN: 18 mg/dL (ref 6–20)
CO2: 24 mmol/L (ref 22–32)
Calcium: 8.2 mg/dL — ABNORMAL LOW (ref 8.9–10.3)
Chloride: 103 mmol/L (ref 101–111)
Creatinine, Ser: 0.81 mg/dL (ref 0.44–1.00)
Glucose, Bld: 157 mg/dL — ABNORMAL HIGH (ref 65–99)
POTASSIUM: 3.5 mmol/L (ref 3.5–5.1)
SODIUM: 136 mmol/L (ref 135–145)

## 2016-12-05 MED ORDER — RIVAROXABAN 10 MG PO TABS: 10.0000 mg | ORAL_TABLET | Freq: Every day | ORAL | 0 refills | Status: DC

## 2016-12-05 MED ORDER — OXYCODONE HCL 5 MG PO TABS: 5.0000 mg | ORAL_TABLET | ORAL | 0 refills | Status: DC | PRN

## 2016-12-05 MED ORDER — TRAMADOL HCL 50 MG PO TABS: 50.0000 mg | ORAL_TABLET | Freq: Four times a day (QID) | ORAL | 0 refills | Status: DC | PRN

## 2016-12-05 MED ORDER — METHOCARBAMOL 500 MG PO TABS: 500.0000 mg | ORAL_TABLET | Freq: Four times a day (QID) | ORAL | 0 refills | Status: DC | PRN

## 2016-12-05 NOTE — Addendum Note (Signed)
Addendum  created 12/05/16 0601 by Victoriano Lain, CRNA   Charge Capture section accepted

## 2016-12-05 NOTE — Progress Notes (Signed)
Physical Therapy Treatment Patient Details Name: Krystal Kent MRN: [id number] DOB: 1945/07/04 Today's Date: 12/05/2016    History of Present Illness RTKA    PT Comments    The patient is progressing well. Will practice  Stairs in AM. Plans DC tomorrow.   Follow Up Recommendations  DC plan and follow up therapy as arranged by surgeon;Outpatient PT     Equipment Recommendations  None recommended by PT    Recommendations for Other Services       Precautions / Restrictions Precautions Precautions: Knee;Fall Required Braces or Orthoses: Knee Immobilizer - Right Knee Immobilizer - Right: Discontinue once straight leg raise with < 10 degree lag    Mobility  Bed Mobility   Bed Mobility: Sit to Supine       Sit to supine: Min assist   General bed mobility comments: assist right leg  Transfers   Equipment used: Rolling walker (2 wheeled) Transfers: Sit to/from Stand Sit to Stand: Supervision         General transfer comment: cues for UE placement; min guard for safety  Ambulation/Gait Ambulation/Gait assistance: Min guard Ambulation Distance (Feet): 100 Feet Assistive device: Rolling walker (2 wheeled) Gait Pattern/deviations: Step-to pattern;Step-through pattern     General Gait Details: cues for sequence, posture   Stairs Stairs: Yes   Stair Management: One rail Right;Forwards;With cane Number of Stairs: 2 General stair comments: cues for sequence  Wheelchair Mobility    Modified Rankin (Stroke Patients Only)       Balance                                            Cognition Arousal/Alertness: Awake/alert                                            Exercises     General Comments        Pertinent Vitals/Pain Pain Score: 3  Pain Location: right knee Pain Descriptors / Indicators: Discomfort Pain Intervention(s): Monitored during session;Repositioned;Patient requesting pain meds-RN notified     Home Living                      Prior Function            PT Goals (current goals can now be found in the care plan section) Progress towards PT goals: Progressing toward goals    Frequency    7X/week      PT Plan Current plan remains appropriate    Co-evaluation              AM-PAC PT "6 Clicks" Daily Activity  Outcome Measure  Difficulty turning over in bed (including adjusting bedclothes, sheets and blankets)?: A Little Difficulty moving from lying on back to sitting on the side of the bed? : A Little Difficulty sitting down on and standing up from a chair with arms (e.g., wheelchair, bedside commode, etc,.)?: A Little Help needed moving to and from a bed to chair (including a wheelchair)?: A Lot Help needed walking in hospital room?: A Lot Help needed climbing 3-5 steps with a railing? : A Lot 6 Click Score: 15    End of Session Equipment Utilized During Treatment: Gait belt;Right knee immobilizer Activity Tolerance: Treatment limited secondary to  medical complications (Comment);Patient tolerated treatment well Patient left: with call bell/phone within reach;in bed Nurse Communication: Mobility status PT Visit Diagnosis: Difficulty in walking, not elsewhere classified (R26.2);Pain Pain - Right/Left: Right Pain - part of body: Knee     Time: 8003-4917 PT Time Calculation (min) (ACUTE ONLY): 17 min  Charges:  $Gait Training: 8-22 mins $Therapeutic Exercise: 8-22 mins $Self Care/Home Management: 8-22                    G CodesTresa Endo PT [phone]    Claretha Cooper 12/05/2016, 4:06 PM

## 2016-12-05 NOTE — Progress Notes (Signed)
Physical Therapy Treatment Patient Details Name: Krystal Kent MRN: [id number] DOB: 05-24-1945 Today's Date: 12/05/2016    History of Present Illness RTKA    PT Comments    The patient is progressing very well today. Plans Dc tomorrow. Has second floor Bedroom.   Follow Up Recommendations  DC plan and follow up therapy as arranged by surgeon;Outpatient PT     Equipment Recommendations  None recommended by PT    Recommendations for Other Services       Precautions / Restrictions Precautions Precautions: Knee;Fall Required Braces or Orthoses: Knee Immobilizer - Right Knee Immobilizer - Right: Discontinue once straight leg raise with < 10 degree lag Restrictions Weight Bearing Restrictions: No    Mobility  Bed Mobility               General bed mobility comments: oob  Transfers   Equipment used: Rolling walker (2 wheeled) Transfers: Sit to/from Stand Sit to Stand: Min guard         General transfer comment: cues for UE placement; min guard for safety  Ambulation/Gait Ambulation/Gait assistance: Min assist;+2 safety/equipment Ambulation Distance (Feet): 100 Feet Assistive device: Rolling walker (2 wheeled) Gait Pattern/deviations: Step-to pattern;Step-through pattern     General Gait Details: cues for sequence, posture   Stairs            Wheelchair Mobility    Modified Rankin (Stroke Patients Only)       Balance                                            Cognition Arousal/Alertness: Awake/alert Behavior During Therapy: WFL for tasks assessed/performed Overall Cognitive Status: Within Functional Limits for tasks assessed                                        Exercises Total Joint Exercises Ankle Circles/Pumps: AROM;Both;10 reps Quad Sets: AROM;Both;10 reps Towel Squeeze: AROM;Both;10 reps Heel Slides: AAROM;Right;10 reps Hip ABduction/ADduction: AAROM;Right;10 reps Straight Leg Raises:  AAROM;Right;10 reps Goniometric ROM: 10-50 knee flexion    General Comments        Pertinent Vitals/Pain Pain Score: 3  Pain Location: right knee Pain Descriptors / Indicators: Discomfort Pain Intervention(s): Monitored during session;Premedicated before session;Ice applied;Repositioned    Home Living Family/patient expects to be discharged to:: Private residence Living Arrangements: Spouse/significant other           Home Equipment: Bedside commode      Prior Function Level of Independence: Independent          PT Goals (current goals can now be found in the care plan section) Acute Rehab PT Goals Patient Stated Goal: to walk Progress towards PT goals: Progressing toward goals    Frequency    7X/week      PT Plan Current plan remains appropriate    Co-evaluation              AM-PAC PT "6 Clicks" Daily Activity  Outcome Measure  Difficulty turning over in bed (including adjusting bedclothes, sheets and blankets)?: A Little Difficulty moving from lying on back to sitting on the side of the bed? : A Little Difficulty sitting down on and standing up from a chair with arms (e.g., wheelchair, bedside commode, etc,.)?: A Little Help needed moving to and  from a bed to chair (including a wheelchair)?: A Lot Help needed walking in hospital room?: A Lot Help needed climbing 3-5 steps with a railing? : A Lot 6 Click Score: 15    End of Session Equipment Utilized During Treatment: Gait belt;Right knee immobilizer Activity Tolerance: Treatment limited secondary to medical complications (Comment) Patient left: in chair;with call bell/phone within reach Nurse Communication: Mobility status Pain - Right/Left: Right Pain - part of body: Knee     Time: 0926-1006 PT Time Calculation (min) (ACUTE ONLY): 40 min  Charges:  $Gait Training: 8-22 mins $Therapeutic Exercise: 8-22 mins $Self Care/Home Management: 8-22                    G CodesTresa Endo  PT [phone]    Claretha Cooper 12/05/2016, 1:21 PM

## 2016-12-05 NOTE — Evaluation (Signed)
Occupational Therapy Evaluation Patient Details Name: Krystal Kent MRN: [id number] DOB: January 06, 1946 Today's Date: 12/05/2016    History of Present Illness RTKA   Clinical Impression   This 71 year old female was admitted for the above sx.  All education was completed. No further OT is needed at this time    Follow Up Recommendations  Supervision/Assistance - 24 hour    Equipment Recommendations  None recommended by OT    Recommendations for Other Services       Precautions / Restrictions Precautions Precautions: Knee;Fall Required Braces or Orthoses: Knee Immobilizer - Right Knee Immobilizer - Right: Discontinue once straight leg raise with < 10 degree lag Restrictions Weight Bearing Restrictions: No      Mobility Bed Mobility               General bed mobility comments: oob  Transfers   Equipment used: Rolling walker (2 wheeled)   Sit to Stand: Min guard         General transfer comment: cues for UE placement; min guard for safety    Balance                                           ADL either performed or assessed with clinical judgement   ADL Overall ADL's : Needs assistance/impaired Eating/Feeding: Independent   Grooming: Set up;Sitting   Upper Body Bathing: Set up;Sitting   Lower Body Bathing: Minimal assistance;Sit to/from stand   Upper Body Dressing : Set up;Sitting   Lower Body Dressing: Moderate assistance;Sit to/from stand   Toilet Transfer: Min guard;RW;Ambulation (to chair)   Toileting- Water quality scientist and Hygiene: Min guard;Sit to/from stand         General ADL Comments: ambulated to bathroom and demonstrated shower transfer.  Handout given.  Pt verbalizes understanding and doesn't feel she needs to practice prior to home.  Husband can assist at home as needed with adls; she had already completed adl this am and used toilet     Vision         Perception     Praxis      Pertinent Vitals/Pain  Pain Score: 5  Pain Location: right knee Pain Descriptors / Indicators: Discomfort Pain Intervention(s): Limited activity within patient's tolerance;Premedicated before session;Monitored during session;Repositioned;Ice applied     Hand Dominance     Extremity/Trunk Assessment Upper Extremity Assessment Upper Extremity Assessment: Overall WFL for tasks assessed           Communication Communication Communication: No difficulties   Cognition Arousal/Alertness: Awake/alert Behavior During Therapy: WFL for tasks assessed/performed Overall Cognitive Status: Within Functional Limits for tasks assessed                                     General Comments       Exercises     Shoulder Instructions      Home Living Family/patient expects to be discharged to:: Private residence Living Arrangements: Spouse/significant other                 Bathroom Shower/Tub: Occupational psychologist: Handicapped height     Home Equipment: Bedside commode          Prior Functioning/Environment Level of Independence: Independent  OT Problem List:        OT Treatment/Interventions:      OT Goals(Current goals can be found in the care plan section) Acute Rehab OT Goals Patient Stated Goal: to walk OT Goal Formulation: All assessment and education complete, DC therapy  OT Frequency:     Barriers to D/C:            Co-evaluation              AM-PAC PT "6 Clicks" Daily Activity     Outcome Measure Help from another person eating meals?: None Help from another person taking care of personal grooming?: A Little Help from another person toileting, which includes using toliet, bedpan, or urinal?: A Little Help from another person bathing (including washing, rinsing, drying)?: A Little Help from another person to put on and taking off regular upper body clothing?: A Little Help from another person to put on and taking off  regular lower body clothing?: A Lot 6 Click Score: 18   End of Session CPM Right Knee CPM Right Knee: Off  Activity Tolerance: Patient tolerated treatment well Patient left: in chair;with call bell/phone within reach  OT Visit Diagnosis: Pain Pain - Right/Left: Right Pain - part of body: Knee                Time: 0177-9390 OT Time Calculation (min): 19 min Charges:  OT General Charges $OT Visit: 1 Visit OT Evaluation $OT Eval Low Complexity: 1 Low G-Codes:     [place], OTR/L [phone] 12/05/2016  Dreyah Montrose 12/05/2016, 10:52 AM

## 2016-12-05 NOTE — Progress Notes (Signed)
   Subjective: 1 Day Post-Op Procedure(s) (LRB): RIGHT TOTAL KNEE ARTHROPLASTY (Right) Patient reports pain as moderate.   Patient seen in rounds for Dr. Wynelle Link. Patient is well, and has had no acute complaints or problems We will start therapy today.  Plan is to go Home after hospital stay.  Objective: Vital signs in last 24 hours: Temp:  [97.2 F (36.2 C)-98.6 F (37 C)] 98.6 F (37 C) (10/09 0543) Pulse Rate:  [58-72] 64 (10/09 0832) Resp:  [12-17] 16 (10/09 0543) BP: (106-143)/(58-77) 143/67 (10/09 0832) SpO2:  [95 %-100 %] 96 % (10/09 0543) Weight:  [74.8 kg (165 lb)] 74.8 kg (165 lb) (10/08 1130)  Intake/Output from previous day:  Intake/Output Summary (Last 24 hours) at 12/05/16 0933 Last data filed at 12/05/16 0645  Gross per 24 hour  Intake          2201.25 ml  Output             1715 ml  Net           486.25 ml    Intake/Output this shift: No intake/output data recorded.  Labs:  Recent Labs  12/05/16 0610  HGB 11.1*    Recent Labs  12/05/16 0610  WBC 11.7*  RBC 3.54*  HCT 33.1*  PLT 193    Recent Labs  12/05/16 0610  NA 136  K 3.5  CL 103  CO2 24  BUN 18  CREATININE 0.81  GLUCOSE 157*  CALCIUM 8.2*   No results for input(s): LABPT, INR in the last 72 hours.  EXAM General - Patient is Alert, Appropriate and Oriented Extremity - Neurovascular intact Sensation intact distally Intact pulses distally Dressing - dressing C/D/I Motor Function - intact, moving foot and toes well on exam.  Hemovac pulled without difficulty.  Past Medical History:  Diagnosis Date  . Hypercholesteremia   . Hypertension   . PONV (postoperative nausea and vomiting)   . Pre-diabetes    11-01-16 HgA1c is 6.4    Assessment/Plan: 1 Day Post-Op Procedure(s) (LRB): RIGHT TOTAL KNEE ARTHROPLASTY (Right) Principal Problem:   OA (osteoarthritis) of knee  Estimated body mass index is 30.67 kg/m as calculated from the following:   Height as of this  encounter: 5' 1.5" (1.562 m).   Weight as of this encounter: 74.8 kg (165 lb). Up with therapy  DVT Prophylaxis - Xarelto and TED hose Weight-Bearing as tolerated to right leg D/C O2 and Pulse OX and try on Room Air  Arlee Muslim, PA-C Orthopaedic Surgery 12/05/2016, 9:33 AM

## 2016-12-05 NOTE — Discharge Summary (Signed)
Physician Discharge Summary   Patient ID: Krystal Kent MRN: [id number] DOB/AGE: [birth year] 71 y.o.  Admit date: 12/04/2016 Discharge date: 12/06/2016  Primary Diagnosis:  Osteoarthritis  Right knee(s) Admission Diagnoses:  Past Medical History:  Diagnosis Date  . Hypercholesteremia   . Hypertension   . PONV (postoperative nausea and vomiting)   . Pre-diabetes    11-01-16 HgA1c is 6.4   Discharge Diagnoses:   Principal Problem:   OA (osteoarthritis) of knee  Estimated body mass index is 30.67 kg/m as calculated from the following:   Height as of this encounter: 5' 1.5" (1.562 m).   Weight as of this encounter: 74.8 kg (165 lb).  Procedure:  Procedure(s) (LRB): RIGHT TOTAL KNEE ARTHROPLASTY (Right)   Consults: None  HPI: Krystal Kent is a 71 y.o. year old female with end stage OA of her right knee with progressively worsening pain and dysfunction. She has constant pain, with activity and at rest and significant functional deficits with difficulties even with ADLs. She has had extensive non-op management including analgesics, injections of cortisone and viscosupplements, and home exercise program, but remains in significant pain with significant dysfunction.Radiographs show bone on bone arthritis medial and patellofemoral. She presents now for right Total Knee Arthroplasty.   Laboratory Data: Admission on 12/04/2016  Component Date Value Ref Range Status  . WBC 12/05/2016 11.7* 4.0 - 10.5 K/uL Final  . RBC 12/05/2016 3.54* 3.87 - 5.11 MIL/uL Final  . Hemoglobin 12/05/2016 11.1* 12.0 - 15.0 g/dL Final  . HCT 12/05/2016 33.1* 36.0 - 46.0 % Final  . MCV 12/05/2016 93.5  78.0 - 100.0 fL Final  . MCH 12/05/2016 31.4  26.0 - 34.0 pg Final  . MCHC 12/05/2016 33.5  30.0 - 36.0 g/dL Final  . RDW 12/05/2016 13.4  11.5 - 15.5 % Final  . Platelets 12/05/2016 193  150 - 400 K/uL Final  . Sodium 12/05/2016 136  135 - 145 mmol/L Final  . Potassium 12/05/2016 3.5  3.5 - 5.1 mmol/L Final    . Chloride 12/05/2016 103  101 - 111 mmol/L Final  . CO2 12/05/2016 24  22 - 32 mmol/L Final  . Glucose, Bld 12/05/2016 157* 65 - 99 mg/dL Final  . BUN 12/05/2016 18  6 - 20 mg/dL Final  . Creatinine, Ser 12/05/2016 0.81  0.44 - 1.00 mg/dL Final  . Calcium 12/05/2016 8.2* 8.9 - 10.3 mg/dL Final  . GFR calc non Af Amer 12/05/2016 >60  >60 mL/min Final  . GFR calc Af Amer 12/05/2016 >60  >60 mL/min Final   Comment: (NOTE) The eGFR has been calculated using the CKD EPI equation. This calculation has not been validated in all clinical situations. eGFR's persistently <60 mL/min signify possible Chronic Kidney Disease.   Georgiann Hahn gap 12/05/2016 9  5 - 15 Final  Hospital Outpatient Visit on 11/29/2016  Component Date Value Ref Range Status  . aPTT 11/29/2016 26  24 - 36 seconds Final  . WBC 11/29/2016 5.3  4.0 - 10.5 K/uL Final  . RBC 11/29/2016 4.50  3.87 - 5.11 MIL/uL Final  . Hemoglobin 11/29/2016 13.8  12.0 - 15.0 g/dL Final  . HCT 11/29/2016 41.4  36.0 - 46.0 % Final  . MCV 11/29/2016 92.0  78.0 - 100.0 fL Final  . MCH 11/29/2016 30.7  26.0 - 34.0 pg Final  . MCHC 11/29/2016 33.3  30.0 - 36.0 g/dL Final  . RDW 11/29/2016 13.2  11.5 - 15.5 % Final  . Platelets 11/29/2016 222  150 - 400 K/uL Final  . Prothrombin Time 11/29/2016 12.1  11.4 - 15.2 seconds Final  . INR 11/29/2016 0.91   Final  . ABO/RH(D) 11/29/2016 A POS   Final  . Antibody Screen 11/29/2016 NEG   Final  . Sample Expiration 11/29/2016 12/07/2016   Final  . Extend sample reason 11/29/2016 NO TRANSFUSIONS OR PREGNANCY IN THE PAST 3 MONTHS   Final  . MRSA, PCR 11/29/2016 NEGATIVE  NEGATIVE Final  . Staphylococcus aureus 11/29/2016 NEGATIVE  NEGATIVE Final   Comment: (NOTE) The Xpert SA Assay (FDA approved for NASAL specimens in patients 49 years of age and older), is one component of a comprehensive surveillance program. It is not intended to diagnose infection nor to guide or monitor treatment.   . Sodium 11/29/2016  140  135 - 145 mmol/L Final  . Potassium 11/29/2016 4.2  3.5 - 5.1 mmol/L Final  . Chloride 11/29/2016 106  101 - 111 mmol/L Final  . CO2 11/29/2016 25  22 - 32 mmol/L Final  . Glucose, Bld 11/29/2016 112* 65 - 99 mg/dL Final  . BUN 11/29/2016 23* 6 - 20 mg/dL Final  . Creatinine, Ser 11/29/2016 0.91  0.44 - 1.00 mg/dL Final  . Calcium 11/29/2016 9.7  8.9 - 10.3 mg/dL Final  . Total Protein 11/29/2016 7.7  6.5 - 8.1 g/dL Final  . Albumin 11/29/2016 4.4  3.5 - 5.0 g/dL Final  . AST 11/29/2016 28  15 - 41 U/L Final  . ALT 11/29/2016 28  14 - 54 U/L Final  . Alkaline Phosphatase 11/29/2016 38  38 - 126 U/L Final  . Total Bilirubin 11/29/2016 0.6  0.3 - 1.2 mg/dL Final  . GFR calc non Af Amer 11/29/2016 >60  >60 mL/min Final  . GFR calc Af Amer 11/29/2016 >60  >60 mL/min Final   Comment: (NOTE) The eGFR has been calculated using the CKD EPI equation. This calculation has not been validated in all clinical situations. eGFR's persistently <60 mL/min signify possible Chronic Kidney Disease.   . Anion gap 11/29/2016 9  5 - 15 Final  . ABO/RH(D) 11/29/2016 A POS   Final     X-Rays:Mm Screening Breast Tomo Bilateral  Result Date: 11/14/2016 CLINICAL DATA:  Screening. EXAM: 2D DIGITAL SCREENING BILATERAL MAMMOGRAM WITH CAD AND ADJUNCT TOMO COMPARISON:  Previous exam(s). ACR Breast Density Category b: There are scattered areas of fibroglandular density. FINDINGS: There are no findings suspicious for malignancy. Images were processed with CAD. IMPRESSION: No mammographic evidence of malignancy. A result letter of this screening mammogram will be mailed directly to the patient. RECOMMENDATION: Screening mammogram in one year. (Code:SM-B-01Y) BI-RADS CATEGORY  1: Negative. Electronically Signed   By: Lajean Manes M.D.   On: 11/14/2016 11:54    EKG: Orders placed or performed during the hospital encounter of 09/03/12  . EKG 12-Lead  . EKG 12-Lead  . ED EKG (<62mns upon arrival to the ED)  . ED  EKG (<113ms upon arrival to the ED)  . EKG     Hospital Course: [facility] Kent a 89.[facility]. [facility]2016 and underwent Procedure(s): RIGHT TOTAL KNEE ARTHROPLASTY.  Patient tolerated the procedure well and was later transferred to the recovery room and then to the orthopaedic floor for postoperative care.  They were given PO and IV analgesics for pain control following their surgery.  They were given 24 hours of postoperative antibiotics of  Anti-infectives    Start     Dose/Rate Route Frequency Ordered Stop   12/04/16 1400  ceFAZolin (ANCEF) IVPB 2g/100 mL premix     2 g 200 mL/hr over 30 Minutes Intravenous Every 6 hours 12/04/16 1141 12/04/16 2022   12/04/16 0625  ceFAZolin (ANCEF) 2-4 GM/100ML-% IVPB    Comments:  Algis Liming   : cabinet override      12/04/16 0625 12/04/16 0802   12/04/16 0620  ceFAZolin (ANCEF) IVPB 2g/100 mL premix     2 g 200 mL/hr over 30 Minutes Intravenous On call to O.R. 12/04/16 2993 12/04/16 0802     and started on DVT prophylaxis in the form of Xarelto.   PT and OT were ordered for total joint protocol.  Discharge planning consulted to help with postop disposition and equipment needs.  Patient had a decent night on the evening of surgery.  They started to get up OOB with therapy on day one. Hemovac drain was pulled without difficulty.  Continued to work with therapy into day two.  Dressing was changed on day two and the incision was healing well.   Patient was seen in rounds on day two and was ready to go home.  Diet - Cardiac diet Follow up - in 2 weeks Activity - WBAT Disposition - Home Condition Upon Discharge - Stable D/C Meds - See DC Summary DVT Prophylaxis - Xarelto   Discharge Instructions    Call MD / Call 911    Complete by:  As directed    If you experience chest pain or shortness of breath, CALL 911 and be transported to the hospital emergency room.  If you  develope a fever above 101 F, pus (white drainage) or increased drainage or redness at the wound, or calf pain, call your surgeon's office.   Change dressing    Complete by:  As directed    Change dressing daily with sterile 4 x 4 inch gauze dressing and apply TED hose. Do not submerge the incision under water.   Constipation Prevention    Complete by:  As directed    Drink plenty of fluids.  Prune juice may be helpful.  You may use a stool softener, such as Colace (over the counter) 100 mg twice a day.  Use MiraLax (over the counter) for constipation as needed.   Diet - low sodium heart healthy    Complete by:  As directed    Discharge instructions    Complete by:  As directed    Take Xarelto for two and a half more weeks, then discontinue Xarelto. Once the patient has completed the Xarelto, they may resume the 81 mg Aspirin.   Pick up stool softner and laxative for home use following surgery while on pain medications. Do not submerge incision under water. Please use good hand washing techniques while changing dressing each day. May shower starting three days after surgery. Please use a clean towel to pat the incision dry following showers. Continue to use ice for pain and swelling after surgery. Do not use any lotions or creams on the incision until instructed by your surgeon.  Wear both TED hose on both legs during the day every day for three weeks, but may remove the TED hose at night at home.  Postoperative Constipation Protocol  Constipation - defined medically as fewer than three stools per week and severe constipation as less than one stool per week.  One of the most common issues patients have following  surgery is constipation.  Even if you have a regular bowel pattern at home, your normal regimen is likely to be disrupted due to multiple reasons following surgery.  Combination of anesthesia, postoperative narcotics, change in appetite and fluid intake all can affect your  bowels.  In order to avoid complications following surgery, here are some recommendations in order to help you during your recovery period.  Colace (docusate) - Pick up an over-the-counter form of Colace or another stool softener and take twice a day as long as you are requiring postoperative pain medications.  Take with a full glass of water daily.  If you experience loose stools or diarrhea, hold the colace until you stool forms back up.  If your symptoms do not get better within 1 week or if they get worse, check with your doctor.  Dulcolax (bisacodyl) - Pick up over-the-counter and take as directed by the product packaging as needed to assist with the movement of your bowels.  Take with a full glass of water.  Use this product as needed if not relieved by Colace only.   MiraLax (polyethylene glycol) - Pick up over-the-counter to have on hand.  MiraLax is a solution that will increase the amount of water in your bowels to assist with bowel movements.  Take as directed and can mix with a glass of water, juice, soda, coffee, or tea.  Take if you go more than two days without a movement. Do not use MiraLax more than once per day. Call your doctor if you are still constipated or irregular after using this medication for 7 days in a row.  If you continue to have problems with postoperative constipation, please contact the office for further assistance and recommendations.  If you experience "the worst abdominal pain ever" or develop nausea or vomiting, please contact the office immediatly for further recommendations for treatment.   Do not put a pillow under the knee. Place it under the heel.    Complete by:  As directed    Do not sit on low chairs, stoools or toilet seats, as it may be difficult to get up from low surfaces    Complete by:  As directed    Driving restrictions    Complete by:  As directed    No driving until released by the physician.   Increase activity slowly as tolerated    Complete  by:  As directed    Lifting restrictions    Complete by:  As directed    No lifting until released by the physician.   Patient may shower    Complete by:  As directed    You may shower without a dressing once there is no drainage.  Do not wash over the wound.  If drainage remains, do not shower until drainage stops.   TED hose    Complete by:  As directed    Use stockings (TED hose) for 3 weeks on both leg(s).  You may remove them at night for sleeping.   Weight bearing as tolerated    Complete by:  As directed    Laterality:  right   Extremity:  Lower     Allergies as of 12/05/2016      Reactions   Demerol [meperidine] Nausea And Vomiting      Medication List    STOP taking these medications   aspirin 81 MG chewable tablet   calcium carbonate 600 MG Tabs tablet Commonly known as:  OS-CAL   cholecalciferol 1000  units tablet Commonly known as:  VITAMIN D   CO Q 10 PO   glucosamine-chondroitin 500-400 MG tablet   multivitamin with minerals tablet   PROBIOTIC PO     TAKE these medications   amLODipine 10 MG tablet Commonly known as:  NORVASC Take 10 mg by mouth daily.   atorvastatin 10 MG tablet Commonly known as:  LIPITOR Take 5 mg by mouth daily.   fenofibrate 48 MG tablet Commonly known as:  TRICOR Take 48 mg by mouth daily.   levothyroxine 75 MCG tablet Commonly known as:  SYNTHROID, LEVOTHROID Take 75 mcg by mouth daily before breakfast.   methocarbamol 500 MG tablet Commonly known as:  ROBAXIN Take 1 tablet (500 mg total) by mouth every 6 (six) hours as needed for muscle spasms.   oxyCODONE 5 MG immediate release tablet Commonly known as:  Oxy IR/ROXICODONE Take 1-2 tablets (5-10 mg total) by mouth every 4 (four) hours as needed for moderate pain or severe pain.   rivaroxaban 10 MG Tabs tablet Commonly known as:  XARELTO Take 1 tablet (10 mg total) by mouth daily with breakfast. Take Xarelto for two and a half more weeks following discharge from  the hospital, then discontinue Xarelto. Once the patient has completed the Xarelto, they may resume the 81 mg Aspirin.   traMADol 50 MG tablet Commonly known as:  ULTRAM Take 1-2 tablets (50-100 mg total) by mouth every 6 (six) hours as needed for moderate pain.   triamterene-hydrochlorothiazide 37.5-25 MG tablet Commonly known as:  MAXZIDE-25 Take 1 tablet by mouth daily.            Durable Medical Equipment        Start     Ordered   12/05/16 1211  For home use only DME Walker rolling  Once    Question:  Patient needs a walker to treat with the following condition  Answer:  Fear for personal safety   12/05/16 1212       Discharge Care Instructions        Start     Ordered   12/05/16 0000  Weight bearing as tolerated    Question Answer Comment  Laterality right   Extremity Lower      12/05/16 2322   12/05/16 0000  Change dressing    Comments:  Change dressing daily with sterile 4 x 4 inch gauze dressing and apply TED hose. Do not submerge the incision under water.   12/05/16 2322     Follow-up Information    Gaynelle Arabian, MD. Schedule an appointment as soon as possible for a visit on 12/19/2016.   Specialty:  Orthopedic Surgery Contact information: [address] [place] [address] [phone]           Signed: Arlee Muslim, PA-C Orthopaedic Surgery 12/05/2016, 11:23 PM

## 2016-12-06 LAB — BASIC METABOLIC PANEL
ANION GAP: 9 (ref 5–15)
BUN: 20 mg/dL (ref 6–20)
CALCIUM: 8.7 mg/dL — AB (ref 8.9–10.3)
CO2: 26 mmol/L (ref 22–32)
Chloride: 105 mmol/L (ref 101–111)
Creatinine, Ser: 0.84 mg/dL (ref 0.44–1.00)
Glucose, Bld: 126 mg/dL — ABNORMAL HIGH (ref 65–99)
POTASSIUM: 3.8 mmol/L (ref 3.5–5.1)
Sodium: 140 mmol/L (ref 135–145)

## 2016-12-06 LAB — CBC
HCT: 33.4 % — ABNORMAL LOW (ref 36.0–46.0)
Hemoglobin: 11 g/dL — ABNORMAL LOW (ref 12.0–15.0)
MCH: 30.7 pg (ref 26.0–34.0)
MCHC: 32.9 g/dL (ref 30.0–36.0)
MCV: 93.3 fL (ref 78.0–100.0)
PLATELETS: 190 10*3/uL (ref 150–400)
RBC: 3.58 MIL/uL — AB (ref 3.87–5.11)
RDW: 13.4 % (ref 11.5–15.5)
WBC: 11.8 10*3/uL — AB (ref 4.0–10.5)

## 2016-12-06 NOTE — Progress Notes (Signed)
Pt states MD plan for OP PT.

## 2016-12-06 NOTE — Progress Notes (Signed)
   Subjective: 2 Days Post-Op Procedure(s) (LRB): RIGHT TOTAL KNEE ARTHROPLASTY (Right) Patient reports pain as mild.   Patient seen in rounds with Dr. Wynelle Link. Patient is well, and has had no acute complaints or problems Patient is ready to go home  Objective: Vital signs in last 24 hours: Temp:  [98 F (36.7 C)-99 F (37.2 C)] 98.5 F (36.9 C) (10/10 0502) Pulse Rate:  [52-79] 67 (10/10 0502) Resp:  [16-18] 16 (10/10 0502) BP: (129-148)/(58-67) 129/63 (10/10 0502) SpO2:  [96 %-100 %] 96 % (10/10 0502)  Intake/Output from previous day:  Intake/Output Summary (Last 24 hours) at 12/06/16 0806 Last data filed at 12/06/16 0600  Gross per 24 hour  Intake              700 ml  Output             1350 ml  Net             -650 ml    Intake/Output this shift: No intake/output data recorded.  Labs:  Recent Labs  12/05/16 0610 12/06/16 0546  HGB 11.1* 11.0*    Recent Labs  12/05/16 0610 12/06/16 0546  WBC 11.7* 11.8*  RBC 3.54* 3.58*  HCT 33.1* 33.4*  PLT 193 190    Recent Labs  12/05/16 0610 12/06/16 0546  NA 136 140  K 3.5 3.8  CL 103 105  CO2 24 26  BUN 18 20  CREATININE 0.81 0.84  GLUCOSE 157* 126*  CALCIUM 8.2* 8.7*   No results for input(s): LABPT, INR in the last 72 hours.  EXAM: General - Patient is Alert and Appropriate Extremity - Neurovascular intact Sensation intact distally Incision - clean, dry Motor Function - intact, moving foot and toes well on exam.   Assessment/Plan: 2 Days Post-Op Procedure(s) (LRB): RIGHT TOTAL KNEE ARTHROPLASTY (Right) Procedure(s) (LRB): RIGHT TOTAL KNEE ARTHROPLASTY (Right) Past Medical History:  Diagnosis Date  . Hypercholesteremia   . Hypertension   . PONV (postoperative nausea and vomiting)   . Pre-diabetes    11-01-16 HgA1c is 6.4   Principal Problem:   OA (osteoarthritis) of knee  Estimated body mass index is 30.67 kg/m as calculated from the following:   Height as of this encounter: 5' 1.5"  (1.562 m).   Weight as of this encounter: 74.8 kg (165 lb). Up with therapy Diet - Cardiac diet Follow up - in 2 weeks Activity - WBAT Disposition - Home Condition Upon Discharge - Stable D/C Meds - See DC Summary DVT Prophylaxis - Xarelto  Arlee Muslim, PA-C Orthopaedic Surgery 12/06/2016, 8:06 AM

## 2016-12-06 NOTE — Progress Notes (Signed)
Physical Therapy Treatment Patient Details Name: Krystal Kent MRN: [id number] DOB: October 04, 1945 Today's Date: 12/06/2016    History of Present Illness RTKA    PT Comments    Ready for DC.   Follow Up Recommendations  DC plan and follow up therapy as arranged by surgeon;Outpatient PT     Equipment Recommendations  None recommended by PT    Recommendations for Other Services       Precautions / Restrictions      Mobility  Bed Mobility   Bed Mobility: Supine to Sit     Supine to sit: Supervision     General bed mobility comments: spouse assisting  Transfers Overall transfer level: Needs assistance Equipment used: Rolling walker (2 wheeled) Transfers: Sit to/from Stand Sit to Stand: Supervision         General transfer comment: cues for UE placement; min guard for safety  Ambulation/Gait Ambulation/Gait assistance: Min guard Ambulation Distance (Feet): 50 Feet Assistive device: Rolling walker (2 wheeled) Gait Pattern/deviations: Step-to pattern;Step-through pattern     General Gait Details: cues for sequence, posture   Stairs     Stair Management: One rail Right;Forwards;With cane Number of Stairs: 2 General stair comments: cues for sequence,  spouse assisting  Wheelchair Mobility    Modified Rankin (Stroke Patients Only)       Balance                                            Cognition Arousal/Alertness: Awake/alert                                            Exercises Total Joint Exercises Ankle Circles/Pumps: AROM;Both;10 reps Quad Sets: AROM;Both;10 reps Towel Squeeze: AROM;Both;10 reps Short Arc Quad: AROM;Right;10 reps Heel Slides: AAROM;Right;10 reps Hip ABduction/ADduction: AAROM;Right;10 reps Straight Leg Raises: AAROM;Right;10 reps Goniometric ROM: 10-50 right knee    General Comments        Pertinent Vitals/Pain Pain Score: 2  Pain Location: right knee Pain Descriptors /  Indicators: Discomfort Pain Intervention(s): Monitored during session    Home Living                      Prior Function            PT Goals (current goals can now be found in the care plan section) Progress towards PT goals: Progressing toward goals    Frequency    7X/week      PT Plan Current plan remains appropriate    Co-evaluation              AM-PAC PT "6 Clicks" Daily Activity  Outcome Measure  Difficulty turning over in bed (including adjusting bedclothes, sheets and blankets)?: A Little Difficulty moving from lying on back to sitting on the side of the bed? : A Little Difficulty sitting down on and standing up from a chair with arms (e.g., wheelchair, bedside commode, etc,.)?: A Little Help needed moving to and from a bed to chair (including a wheelchair)?: A Little Help needed walking in hospital room?: A Little Help needed climbing 3-5 steps with a railing? : A Lot 6 Click Score: 17    End of Session Equipment Utilized During Treatment: Right knee immobilizer Activity Tolerance: Patient tolerated  treatment well Patient left: in chair Nurse Communication: Mobility status PT Visit Diagnosis: Difficulty in walking, not elsewhere classified (R26.2);Pain Pain - Right/Left: Right Pain - part of body: Knee     Time: 1150-1200 PT Time Calculation (min) (ACUTE ONLY): 10 min  Charges:  $Gait Training: 8-22 mins                    G Codes:           Claretha Cooper 12/06/2016, 2:39 PM

## 2016-12-06 NOTE — Progress Notes (Signed)
Physical Therapy Treatment Patient Details Name: Krystal Kent MRN: [id number] DOB: 1945-11-08 Today's Date: 12/06/2016    History of Present Illness RTKA    PT Comments    The patient performed exercises. Will practice steps when spouse present.   Follow Up Recommendations  DC plan and follow up therapy as arranged by surgeon;Outpatient PT     Equipment Recommendations  None recommended by PT    Recommendations for Other Services       Precautions / Restrictions      Mobility  Bed Mobility                  Transfers                    Ambulation/Gait                 Stairs            Wheelchair Mobility    Modified Rankin (Stroke Patients Only)       Balance                                            Cognition                                              Exercises Total Joint Exercises Ankle Circles/Pumps: AROM;Both;10 reps Quad Sets: AROM;Both;10 reps Towel Squeeze: AROM;Both;10 reps Short Arc Quad: AROM;Right;10 reps Heel Slides: AAROM;Right;10 reps Hip ABduction/ADduction: AAROM;Right;10 reps Straight Leg Raises: AAROM;Right;10 reps Goniometric ROM: 10-50 right knee    General Comments        Pertinent Vitals/Pain Pain Score: 2  Pain Location: right knee Pain Descriptors / Indicators: Discomfort Pain Intervention(s): Premedicated before session;Ice applied    Home Living                      Prior Function            PT Goals (current goals can now be found in the care plan section) Progress towards PT goals: Progressing toward goals    Frequency    7X/week      PT Plan Current plan remains appropriate    Co-evaluation              AM-PAC PT "6 Clicks" Daily Activity  Outcome Measure  Difficulty turning over in bed (including adjusting bedclothes, sheets and blankets)?: A Little Difficulty moving from lying on back to sitting on the  side of the bed? : A Little Difficulty sitting down on and standing up from a chair with arms (e.g., wheelchair, bedside commode, etc,.)?: A Little Help needed moving to and from a bed to chair (including a wheelchair)?: A Little Help needed walking in hospital room?: A Little Help needed climbing 3-5 steps with a railing? : A Lot 6 Click Score: 17    End of Session   Activity Tolerance: Patient tolerated treatment well Patient left: in bed;with call bell/phone within reach Nurse Communication: Mobility status PT Visit Diagnosis: Difficulty in walking, not elsewhere classified (R26.2);Pain Pain - Right/Left: Right Pain - part of body: Knee     Time: 3382-5053 PT Time Calculation (min) (ACUTE ONLY): 36 min  Charges:  $Gait Training: 23-37 mins  G Codes:         Claretha Cooper 12/06/2016, 2:37 PM

## 2016-12-06 NOTE — Care Management Note (Signed)
Case Management Note  Patient Details  Name: Krystal Kent MRN: [id number] Date of Birth: October 25, 1945  Subjective/Objective:    OA                Action/Plan: Home with OP PT/MD   Expected Discharge Date:  12/06/16               Expected Discharge Plan:  OP Rehab  In-House Referral:     Discharge planning Services  CM Consult  Post Acute Care Choice:  Durable Medical Equipment Choice offered to:  Patient  DME Arranged:  [facility] D[facility] Agency:  [place] Arranged:    [facility] Agency:     Status of Service:  Completed, signed off  If discussed at [place] of Stay Meetings, dates discussed:    Additional CommentsPurcell Mouton, RN 12/06/2016, 9:54 AM

## 2016-12-08 DIAGNOSIS — M25661 Stiffness of right knee, not elsewhere classified: Secondary | ICD-10-CM | POA: Diagnosis not present

## 2016-12-11 DIAGNOSIS — M25661 Stiffness of right knee, not elsewhere classified: Secondary | ICD-10-CM | POA: Diagnosis not present

## 2016-12-13 DIAGNOSIS — M25661 Stiffness of right knee, not elsewhere classified: Secondary | ICD-10-CM | POA: Diagnosis not present

## 2016-12-15 DIAGNOSIS — M25661 Stiffness of right knee, not elsewhere classified: Secondary | ICD-10-CM | POA: Diagnosis not present

## 2016-12-18 DIAGNOSIS — M25661 Stiffness of right knee, not elsewhere classified: Secondary | ICD-10-CM | POA: Diagnosis not present

## 2016-12-20 DIAGNOSIS — M25661 Stiffness of right knee, not elsewhere classified: Secondary | ICD-10-CM | POA: Diagnosis not present

## 2016-12-22 DIAGNOSIS — M25661 Stiffness of right knee, not elsewhere classified: Secondary | ICD-10-CM | POA: Diagnosis not present

## 2016-12-25 DIAGNOSIS — M25661 Stiffness of right knee, not elsewhere classified: Secondary | ICD-10-CM | POA: Diagnosis not present

## 2016-12-27 DIAGNOSIS — M25661 Stiffness of right knee, not elsewhere classified: Secondary | ICD-10-CM | POA: Diagnosis not present

## 2016-12-29 DIAGNOSIS — M25661 Stiffness of right knee, not elsewhere classified: Secondary | ICD-10-CM | POA: Diagnosis not present

## 2017-01-02 DIAGNOSIS — M25661 Stiffness of right knee, not elsewhere classified: Secondary | ICD-10-CM | POA: Diagnosis not present

## 2017-01-04 DIAGNOSIS — M25661 Stiffness of right knee, not elsewhere classified: Secondary | ICD-10-CM | POA: Diagnosis not present

## 2017-01-05 DIAGNOSIS — M25661 Stiffness of right knee, not elsewhere classified: Secondary | ICD-10-CM | POA: Diagnosis not present

## 2017-01-08 DIAGNOSIS — M25661 Stiffness of right knee, not elsewhere classified: Secondary | ICD-10-CM | POA: Diagnosis not present

## 2017-01-09 DIAGNOSIS — M1711 Unilateral primary osteoarthritis, right knee: Secondary | ICD-10-CM | POA: Diagnosis not present

## 2017-01-11 DIAGNOSIS — M25661 Stiffness of right knee, not elsewhere classified: Secondary | ICD-10-CM | POA: Diagnosis not present

## 2017-01-12 DIAGNOSIS — M25661 Stiffness of right knee, not elsewhere classified: Secondary | ICD-10-CM | POA: Diagnosis not present

## 2017-01-15 DIAGNOSIS — M25661 Stiffness of right knee, not elsewhere classified: Secondary | ICD-10-CM | POA: Diagnosis not present

## 2017-01-17 DIAGNOSIS — M25661 Stiffness of right knee, not elsewhere classified: Secondary | ICD-10-CM | POA: Diagnosis not present

## 2017-01-23 DIAGNOSIS — M25661 Stiffness of right knee, not elsewhere classified: Secondary | ICD-10-CM | POA: Diagnosis not present

## 2017-01-25 DIAGNOSIS — M25661 Stiffness of right knee, not elsewhere classified: Secondary | ICD-10-CM | POA: Diagnosis not present

## 2017-01-26 DIAGNOSIS — M25661 Stiffness of right knee, not elsewhere classified: Secondary | ICD-10-CM | POA: Diagnosis not present

## 2017-01-29 DIAGNOSIS — M25661 Stiffness of right knee, not elsewhere classified: Secondary | ICD-10-CM | POA: Diagnosis not present

## 2017-01-31 DIAGNOSIS — M25661 Stiffness of right knee, not elsewhere classified: Secondary | ICD-10-CM | POA: Diagnosis not present

## 2017-02-02 DIAGNOSIS — M25661 Stiffness of right knee, not elsewhere classified: Secondary | ICD-10-CM | POA: Diagnosis not present

## 2017-02-07 DIAGNOSIS — M25661 Stiffness of right knee, not elsewhere classified: Secondary | ICD-10-CM | POA: Diagnosis not present

## 2017-02-08 DIAGNOSIS — M25661 Stiffness of right knee, not elsewhere classified: Secondary | ICD-10-CM | POA: Diagnosis not present

## 2017-02-12 DIAGNOSIS — M25661 Stiffness of right knee, not elsewhere classified: Secondary | ICD-10-CM | POA: Diagnosis not present

## 2017-02-13 DIAGNOSIS — M25661 Stiffness of right knee, not elsewhere classified: Secondary | ICD-10-CM | POA: Diagnosis not present

## 2017-02-26 DIAGNOSIS — M25661 Stiffness of right knee, not elsewhere classified: Secondary | ICD-10-CM | POA: Diagnosis not present

## 2017-03-01 DIAGNOSIS — M25661 Stiffness of right knee, not elsewhere classified: Secondary | ICD-10-CM | POA: Diagnosis not present

## 2017-03-05 ENCOUNTER — Encounter (HOSPITAL_COMMUNITY): Payer: Self-pay | Admitting: *Deleted

## 2017-03-05 ENCOUNTER — Other Ambulatory Visit: Payer: Self-pay

## 2017-03-05 NOTE — Progress Notes (Signed)
Need orders in epic.  Surgery on 03/12/17.  Thank You.

## 2017-03-06 ENCOUNTER — Ambulatory Visit: Payer: Self-pay | Admitting: Orthopedic Surgery

## 2017-03-12 ENCOUNTER — Ambulatory Visit (HOSPITAL_COMMUNITY): Payer: PPO | Admitting: Anesthesiology

## 2017-03-12 ENCOUNTER — Ambulatory Visit (HOSPITAL_COMMUNITY)
Admission: RE | Admit: 2017-03-12 | Discharge: 2017-03-12 | Disposition: A | Discharge status: Home / Self Care | Payer: PPO | Source: Ambulatory Visit | Attending: Orthopedic Surgery | Admitting: Orthopedic Surgery

## 2017-03-12 ENCOUNTER — Encounter (HOSPITAL_COMMUNITY)
Admission: RE | Disposition: A | Discharge status: Home / Self Care | Payer: Self-pay | Source: Ambulatory Visit | Attending: Orthopedic Surgery

## 2017-03-12 ENCOUNTER — Encounter (HOSPITAL_COMMUNITY): Payer: Self-pay

## 2017-03-12 DIAGNOSIS — E039 Hypothyroidism, unspecified: Secondary | ICD-10-CM | POA: Diagnosis not present

## 2017-03-12 DIAGNOSIS — R7303 Prediabetes: Secondary | ICD-10-CM | POA: Diagnosis not present

## 2017-03-12 DIAGNOSIS — M24661 Ankylosis, right knee: Secondary | ICD-10-CM | POA: Insufficient documentation

## 2017-03-12 DIAGNOSIS — K219 Gastro-esophageal reflux disease without esophagitis: Secondary | ICD-10-CM | POA: Diagnosis not present

## 2017-03-12 DIAGNOSIS — Z79890 Hormone replacement therapy (postmenopausal): Secondary | ICD-10-CM | POA: Insufficient documentation

## 2017-03-12 DIAGNOSIS — Z7982 Long term (current) use of aspirin: Secondary | ICD-10-CM | POA: Diagnosis not present

## 2017-03-12 DIAGNOSIS — E7800 Pure hypercholesterolemia, unspecified: Secondary | ICD-10-CM | POA: Diagnosis not present

## 2017-03-12 DIAGNOSIS — I10 Essential (primary) hypertension: Secondary | ICD-10-CM | POA: Insufficient documentation

## 2017-03-12 DIAGNOSIS — Z79899 Other long term (current) drug therapy: Secondary | ICD-10-CM | POA: Diagnosis not present

## 2017-03-12 DIAGNOSIS — Z96653 Presence of artificial knee joint, bilateral: Secondary | ICD-10-CM | POA: Diagnosis not present

## 2017-03-12 DIAGNOSIS — Z7901 Long term (current) use of anticoagulants: Secondary | ICD-10-CM | POA: Insufficient documentation

## 2017-03-12 DIAGNOSIS — T8482XD Fibrosis due to internal orthopedic prosthetic devices, implants and grafts, subsequent encounter: Secondary | ICD-10-CM

## 2017-03-12 DIAGNOSIS — M25661 Stiffness of right knee, not elsewhere classified: Secondary | ICD-10-CM | POA: Diagnosis not present

## 2017-03-12 DIAGNOSIS — M1990 Unspecified osteoarthritis, unspecified site: Secondary | ICD-10-CM | POA: Diagnosis not present

## 2017-03-12 DIAGNOSIS — T8482XA Fibrosis due to internal orthopedic prosthetic devices, implants and grafts, initial encounter: Secondary | ICD-10-CM | POA: Diagnosis not present

## 2017-03-12 HISTORY — DX: Gastro-esophageal reflux disease without esophagitis: K21.9

## 2017-03-12 HISTORY — DX: Unspecified osteoarthritis, unspecified site: M19.90

## 2017-03-12 HISTORY — PX: KNEE CLOSED REDUCTION: SHX995

## 2017-03-12 HISTORY — DX: Hypothyroidism, unspecified: E03.9

## 2017-03-12 SURGERY — MANIPULATION, KNEE, CLOSED
Anesthesia: General | Laterality: Right

## 2017-03-12 MED ORDER — CHLORHEXIDINE GLUCONATE 4 % EX LIQD: 60.0000 mL | Freq: Once | CUTANEOUS | Status: DC

## 2017-03-12 MED ORDER — LIDOCAINE 2% (20 MG/ML) 5 ML SYRINGE
INTRAMUSCULAR | Status: DC | PRN
  Administered 2017-03-12: 100 mg via INTRAVENOUS

## 2017-03-12 MED ORDER — POVIDONE-IODINE 10 % EX SWAB: 2.0000 "application " | Freq: Once | CUTANEOUS | Status: DC

## 2017-03-12 MED ORDER — DEXAMETHASONE SODIUM PHOSPHATE 10 MG/ML IJ SOLN: 10.0000 mg | Freq: Once | INTRAMUSCULAR | Status: DC

## 2017-03-12 MED ORDER — PROPOFOL 10 MG/ML IV BOLUS
INTRAVENOUS | Status: DC | PRN
  Administered 2017-03-12: 150 mg via INTRAVENOUS

## 2017-03-12 MED ORDER — FENTANYL CITRATE (PF) 100 MCG/2ML IJ SOLN: 25.0000 ug | INTRAMUSCULAR | Status: DC | PRN

## 2017-03-12 MED ORDER — SODIUM CHLORIDE 0.9 % IV SOLN: INTRAVENOUS | Status: DC

## 2017-03-12 MED ORDER — KETOROLAC TROMETHAMINE 30 MG/ML IJ SOLN: 15.0000 mg | Freq: Once | INTRAMUSCULAR | Status: DC | PRN

## 2017-03-12 MED ORDER — PROMETHAZINE HCL 25 MG/ML IJ SOLN: 6.2500 mg | INTRAMUSCULAR | Status: DC | PRN

## 2017-03-12 MED ORDER — OXYCODONE HCL 5 MG PO TABS: 5.0000 mg | ORAL_TABLET | Freq: Once | ORAL | Status: DC | PRN

## 2017-03-12 MED ORDER — OXYCODONE HCL 5 MG/5ML PO SOLN
5.0000 mg | Freq: Once | ORAL | Status: DC | PRN
  Filled 2017-03-12: qty 5

## 2017-03-12 MED ORDER — PROPOFOL 10 MG/ML IV BOLUS
INTRAVENOUS | Status: AC
  Filled 2017-03-12: qty 20

## 2017-03-12 MED ORDER — LACTATED RINGERS IV SOLN
INTRAVENOUS | Status: DC | PRN
  Administered 2017-03-12: 16:00:00 via INTRAVENOUS

## 2017-03-12 MED ORDER — LACTATED RINGERS IV SOLN
INTRAVENOUS | Status: DC
  Administered 2017-03-12: 14:00:00 via INTRAVENOUS

## 2017-03-12 MED ORDER — ACETAMINOPHEN 10 MG/ML IV SOLN
1000.0000 mg | Freq: Once | INTRAVENOUS | Status: AC
  Administered 2017-03-12: 1000 mg via INTRAVENOUS
  Filled 2017-03-12: qty 100

## 2017-03-12 MED ORDER — LIDOCAINE 2% (20 MG/ML) 5 ML SYRINGE
INTRAMUSCULAR | Status: AC
  Filled 2017-03-12: qty 10

## 2017-03-12 SURGICAL SUPPLY — 10 items
BANDAGE ADH SHEER 1  50/CT (GAUZE/BANDAGES/DRESSINGS) IMPLANT
COVER SURGICAL LIGHT HANDLE (MISCELLANEOUS) ×3 IMPLANT
GAUZE SPONGE 4X4 12PLY STRL (GAUZE/BANDAGES/DRESSINGS) IMPLANT
GLOVE BIO SURGEON STRL SZ8 (GLOVE) ×3 IMPLANT
GLOVE BIOGEL PI IND STRL 8 (GLOVE) ×1 IMPLANT
GLOVE BIOGEL PI INDICATOR 8 (GLOVE) ×2
NDL SAFETY ECLIPSE 18X1.5 (NEEDLE) IMPLANT
NEEDLE HYPO 18GX1.5 SHARP (NEEDLE)
SWABSTICK PVP IODINE (MISCELLANEOUS) ×3 IMPLANT
SYR CONTROL 10ML LL (SYRINGE) IMPLANT

## 2017-03-12 NOTE — Transfer of Care (Signed)
Immediate Anesthesia Transfer of Care Note  Patient: Krystal Kent  Procedure(s) Performed: CLOSED MANIPULATION KNEE (Right )  Patient Location: PACU  Anesthesia Type:General  Level of Consciousness: awake, alert  and oriented  Airway & Oxygen Therapy: Patient Spontanous Breathing  Post-op Assessment: Report given to RN and Post -op Vital signs reviewed and stable  Post vital signs: Reviewed and stable  Last Vitals:  Vitals:   03/12/17 1333  BP: (!) 146/79  Pulse: 80  Resp: 16  Temp: 37.2 C  SpO2: 97%    Last Pain:  Vitals:   03/12/17 1333  TempSrc: Oral         Complications: No apparent anesthesia complications

## 2017-03-12 NOTE — Anesthesia Preprocedure Evaluation (Addendum)
Anesthesia Evaluation  Patient identified by MRN, date of birth, ID band Patient awake    Reviewed: Allergy & Precautions, H&P , NPO status , Patient's Chart, lab work & pertinent test results  History of Anesthesia Complications (+) PONV and history of anesthetic complications  Airway Mallampati: II   Neck ROM: full    Dental  (+) Dental Advisory Given   Pulmonary neg pulmonary ROS,    breath sounds clear to auscultation       Cardiovascular hypertension, Pt. on medications  Rhythm:regular Rate:Normal     Neuro/Psych    GI/Hepatic GERD  ,  Endo/Other  Hypothyroidism   Renal/GU      Musculoskeletal  (+) Arthritis ,   Abdominal   Peds  Hematology   Anesthesia Other Findings   Reproductive/Obstetrics                           Anesthesia Physical  Anesthesia Plan  ASA: II  Anesthesia Plan: General   Post-op Pain Management:  Regional for Post-op pain   Induction: Intravenous  PONV Risk Score and Plan: 3 and Ondansetron, Dexamethasone, Propofol infusion, Treatment may vary due to age or medical condition and Midazolam  Airway Management Planned: Mask  Additional Equipment:   Intra-op Plan:   Post-operative Plan:   Informed Consent: I have reviewed the patients History and Physical, chart, labs and discussed the procedure including the risks, benefits and alternatives for the proposed anesthesia with the patient or authorized representative who has indicated his/her understanding and acceptance.   Dental advisory given  Plan Discussed with: CRNA  Anesthesia Plan Comments:        Anesthesia Quick Evaluation

## 2017-03-12 NOTE — Anesthesia Procedure Notes (Signed)
Procedure Name: General with mask airway Date/Time: 03/12/2017 3:45 PM Performed by: Cynda Familia, CRNA Pre-anesthesia Checklist: Patient identified, Emergency Drugs available, Suction available, Patient being monitored and Timeout performed Patient Re-evaluated:Patient Re-evaluated prior to induction Induction Type: IV induction Ventilation: Mask ventilation without difficulty Placement Confirmation: positive ETCO2 and breath sounds checked- equal and bilateral Dental Injury: Teeth and Oropharynx as per pre-operative assessment  Comments: Smooth IV induction [place]--- mask aw

## 2017-03-12 NOTE — Anesthesia Postprocedure Evaluation (Signed)
Anesthesia Post Note  Patient: Krystal Kent  Procedure(s) Performed: CLOSED MANIPULATION KNEE (Right )     Patient location during evaluation: PACU Anesthesia Type: General Level of consciousness: sedated and patient cooperative Pain management: pain level controlled Vital Signs Assessment: post-procedure vital signs reviewed and stable Respiratory status: spontaneous breathing Cardiovascular status: stable Anesthetic complications: no    Last Vitals:  Vitals:   03/12/17 1636 03/12/17 1647  BP: (!) 142/72 (!) 142/73  Pulse: 76 71  Resp: 14 13  Temp: 36.8 C   SpO2: 98% 100%    Last Pain:  Vitals:   03/12/17 1700  TempSrc:   PainSc: 0-No pain                 Nolon Nations

## 2017-03-12 NOTE — Op Note (Signed)
  OPERATIVE REPORT   PREOPERATIVE DIAGNOSIS: Arthrofibrosis, Right  knee.   POSTOPERATIVE DIAGNOSIS: Arthrofibrosis, Right knee.   PROCEDURE:  Right  knee closed manipulation.   SURGEON: Gaynelle Arabian, MD   ASSISTANT: None.   ANESTHESIA: General.   COMPLICATIONS: None.   CONDITION: Stable to Recovery.   Pre-manipulation range of motion is 5-95.  Post-manipulation range of  Motion is 0-130  PROCEDURE IN DETAIL: After successful administration of general  anesthetic, exam under anesthesia was performed showing range of motion  5-95 degrees. I then placed my chest against the proximal tibia,  flexing the knee with audible lysis of adhesions. I was easily able to  get the knee flexed to 130  degrees. I then put the knee back in extension and with some  patellar manipulation and gentle pressure got tof full  Extension.The patient was subsequently awakened and transported to Recovery in  stable condition.

## 2017-03-12 NOTE — Interval H&P Note (Signed)
History and Physical Interval Note:  03/12/2017 3:31 PM  Krystal Kent  has presented today for surgery, with the diagnosis of Arthrofibrosis Right knee  The various methods of treatment have been discussed with the patient and family. After consideration of risks, benefits and other options for treatment, the patient has consented to  Procedure(s): CLOSED MANIPULATION KNEE (Right) as a surgical intervention .  The patient's history has been reviewed, patient examined, no change in status, stable for surgery.  I have reviewed the patient's chart and labs.  Questions were answered to the patient's satisfaction.     Pilar Plate Coriana Angello

## 2017-03-12 NOTE — Discharge Instructions (Signed)
Resume your physical therapy tomorrow  Resume activities as tolerated  I was able to flex your knee 130 degrees

## 2017-03-12 NOTE — H&P (Signed)
CC- Krystal Kent is a 72 y.o. female who presents with right knee stiffness.  HPI- . Knee Pain: Patient presents with stiffness involving the  right knee. Onset of the symptoms was several months ago. Inciting event: She had a right Total Knee Arthroplasty in 10/18 and has had stiffness despite adequate pjysical therapy. She has not been able to successfully flex her knee beyond 95 degrees. She had to have a closed manipulation after her left TKA and presents now for right knee closed manipulation.   Past Medical History:  Diagnosis Date  . Arthritis   . GERD (gastroesophageal reflux disease)   . Hypercholesteremia   . Hypertension   . Hypothyroidism   . PONV (postoperative nausea and vomiting)   . Pre-diabetes    11-01-16 HgA1c is 6.4    Past Surgical History:  Procedure Laterality Date  . ABDOMINAL HYSTERECTOMY    . CHOLECYSTECTOMY    . DIAGNOSTIC LAPAROSCOPY    . TOTAL KNEE ARTHROPLASTY    . TOTAL KNEE ARTHROPLASTY Right 12/04/2016   Procedure: RIGHT TOTAL KNEE ARTHROPLASTY;  Surgeon: Gaynelle Arabian, MD;  Location: [facility];  Service: Orthopedics;  Laterality: Right;  . URETER SURGERY     placed stent temporary to repair adhesion     Prior to Admission medications   Medication Sig Start Date End Date Taking? Authorizing Provider  acetaminophen (TYLENOL) 500 MG tablet Take 250-500 mg by mouth daily as needed for moderate pain or headache.   Yes [provider]  amLODipine (NORVASC) 10 MG tablet Take 10 mg by mouth daily.   Yes [provider]  aspirin EC 81 MG tablet Take 81 mg by mouth at bedtime.   Yes [provider]  atorvastatin (LIPITOR) 10 MG tablet Take 5 mg by mouth at bedtime.    Yes [provider]  calcium carbonate (TUMS - DOSED IN MG ELEMENTAL CALCIUM) 500 MG chewable tablet Chew 1 tablet by mouth at bedtime.   Yes [provider]  fenofibrate (TRICOR) 48 MG tablet Take 48 mg by mouth at bedtime.    Yes [provider]  levothyroxine (SYNTHROID, LEVOTHROID) 75 MCG tablet Take 75 mcg by mouth daily before breakfast.   Yes [provider]  triamterene-hydrochlorothiazide (MAXZIDE-25) 37.5-25 MG per tablet Take 1 tablet by mouth daily.   Yes [provider]  methocarbamol (ROBAXIN) 500 MG tablet Take 1 tablet (500 mg total) by mouth every 6 (six) hours as needed for muscle spasms. Patient not taking: Reported on 03/05/2017 12/05/16   Dara Lords, Alexzandrew L, PA-C  oxyCODONE (OXY IR/ROXICODONE) 5 MG immediate release tablet Take 1-2 tablets (5-10 mg total) by mouth every 4 (four) hours as needed for moderate pain or severe pain. Patient not taking: Reported on 03/05/2017 12/05/16   Dara Lords, Alexzandrew L, PA-C  rivaroxaban (XARELTO) 10 MG TABS tablet Take 1 tablet (10 mg total) by mouth daily with breakfast. Take Xarelto for two and a half more weeks following discharge from the hospital, then discontinue Xarelto. Once the patient has completed the Xarelto, they may resume the 81 mg Aspirin. Patient not taking: Reported on 03/05/2017 12/06/16   Dara Lords, Alexzandrew L, PA-C  traMADol (ULTRAM) 50 MG tablet Take 1-2 tablets (50-100 mg total) by mouth every 6 (six) hours as needed for moderate pain. Patient not taking: Reported on 03/05/2017 12/05/16   Dara Lords, Alexzandrew L, PA-C   KNEE EXAM antalgic gait, no warmth or effusion, reduced range of motion (5-95 degrees), collateral ligaments intact  Physical  Examination: General appearance - alert, well appearing, and in no distress Mental status - alert, oriented to person, place, and time Chest - clear to auscultation, no wheezes, rales or rhonchi, symmetric air entry Heart - normal rate, regular rhythm, normal S1, S2, no murmurs, rubs, clicks or gallops Abdomen - soft, nontender, nondistended, no masses or organomegaly Neurological - alert, oriented, normal speech, no focal findings or movement disorder noted   Asessment/Plan--- Rightt knee arthrofibrosis-  - Plan right knee closed manipulation. Procedure risks and potential comps discussed with patient who elects to proceed. Goals are decreased pain and increased function with a high likelihood of achieving both

## 2017-03-13 ENCOUNTER — Encounter (HOSPITAL_COMMUNITY): Payer: Self-pay | Admitting: Orthopedic Surgery

## 2017-03-13 DIAGNOSIS — M25661 Stiffness of right knee, not elsewhere classified: Secondary | ICD-10-CM | POA: Diagnosis not present

## 2017-03-14 DIAGNOSIS — M25661 Stiffness of right knee, not elsewhere classified: Secondary | ICD-10-CM | POA: Diagnosis not present

## 2017-03-16 DIAGNOSIS — M25661 Stiffness of right knee, not elsewhere classified: Secondary | ICD-10-CM | POA: Diagnosis not present

## 2017-03-19 DIAGNOSIS — M25661 Stiffness of right knee, not elsewhere classified: Secondary | ICD-10-CM | POA: Diagnosis not present

## 2017-03-21 DIAGNOSIS — M25661 Stiffness of right knee, not elsewhere classified: Secondary | ICD-10-CM | POA: Diagnosis not present

## 2017-03-23 DIAGNOSIS — M25661 Stiffness of right knee, not elsewhere classified: Secondary | ICD-10-CM | POA: Diagnosis not present

## 2017-03-26 DIAGNOSIS — M25661 Stiffness of right knee, not elsewhere classified: Secondary | ICD-10-CM | POA: Diagnosis not present

## 2017-03-28 DIAGNOSIS — M25661 Stiffness of right knee, not elsewhere classified: Secondary | ICD-10-CM | POA: Diagnosis not present

## 2017-03-30 DIAGNOSIS — M25661 Stiffness of right knee, not elsewhere classified: Secondary | ICD-10-CM | POA: Diagnosis not present

## 2017-04-02 DIAGNOSIS — M25661 Stiffness of right knee, not elsewhere classified: Secondary | ICD-10-CM | POA: Diagnosis not present

## 2017-04-04 DIAGNOSIS — M25661 Stiffness of right knee, not elsewhere classified: Secondary | ICD-10-CM | POA: Diagnosis not present

## 2017-04-06 DIAGNOSIS — M25661 Stiffness of right knee, not elsewhere classified: Secondary | ICD-10-CM | POA: Diagnosis not present

## 2017-04-09 DIAGNOSIS — M25661 Stiffness of right knee, not elsewhere classified: Secondary | ICD-10-CM | POA: Diagnosis not present

## 2017-04-11 DIAGNOSIS — M25661 Stiffness of right knee, not elsewhere classified: Secondary | ICD-10-CM | POA: Diagnosis not present

## 2017-04-13 DIAGNOSIS — M25661 Stiffness of right knee, not elsewhere classified: Secondary | ICD-10-CM | POA: Diagnosis not present

## 2017-04-16 DIAGNOSIS — M25661 Stiffness of right knee, not elsewhere classified: Secondary | ICD-10-CM | POA: Diagnosis not present

## 2017-04-18 DIAGNOSIS — M25661 Stiffness of right knee, not elsewhere classified: Secondary | ICD-10-CM | POA: Diagnosis not present

## 2017-04-20 DIAGNOSIS — M25661 Stiffness of right knee, not elsewhere classified: Secondary | ICD-10-CM | POA: Diagnosis not present

## 2017-04-23 DIAGNOSIS — M25661 Stiffness of right knee, not elsewhere classified: Secondary | ICD-10-CM | POA: Diagnosis not present

## 2017-04-25 DIAGNOSIS — M25661 Stiffness of right knee, not elsewhere classified: Secondary | ICD-10-CM | POA: Diagnosis not present

## 2017-04-27 DIAGNOSIS — M25661 Stiffness of right knee, not elsewhere classified: Secondary | ICD-10-CM | POA: Diagnosis not present

## 2017-04-30 DIAGNOSIS — M25661 Stiffness of right knee, not elsewhere classified: Secondary | ICD-10-CM | POA: Diagnosis not present

## 2017-05-01 DIAGNOSIS — Z86018 Personal history of other benign neoplasm: Secondary | ICD-10-CM | POA: Diagnosis not present

## 2017-05-01 DIAGNOSIS — Z23 Encounter for immunization: Secondary | ICD-10-CM | POA: Diagnosis not present

## 2017-05-01 DIAGNOSIS — D1800 Hemangioma unspecified site: Secondary | ICD-10-CM | POA: Diagnosis not present

## 2017-05-01 DIAGNOSIS — L814 Other melanin hyperpigmentation: Secondary | ICD-10-CM | POA: Diagnosis not present

## 2017-05-01 DIAGNOSIS — L570 Actinic keratosis: Secondary | ICD-10-CM | POA: Diagnosis not present

## 2017-05-01 DIAGNOSIS — D225 Melanocytic nevi of trunk: Secondary | ICD-10-CM | POA: Diagnosis not present

## 2017-05-01 DIAGNOSIS — L719 Rosacea, unspecified: Secondary | ICD-10-CM | POA: Diagnosis not present

## 2017-05-01 DIAGNOSIS — D2271 Melanocytic nevi of right lower limb, including hip: Secondary | ICD-10-CM | POA: Diagnosis not present

## 2017-05-01 DIAGNOSIS — L821 Other seborrheic keratosis: Secondary | ICD-10-CM | POA: Diagnosis not present

## 2017-05-02 DIAGNOSIS — M25661 Stiffness of right knee, not elsewhere classified: Secondary | ICD-10-CM | POA: Diagnosis not present

## 2017-05-04 DIAGNOSIS — M25661 Stiffness of right knee, not elsewhere classified: Secondary | ICD-10-CM | POA: Diagnosis not present

## 2017-05-07 DIAGNOSIS — M25661 Stiffness of right knee, not elsewhere classified: Secondary | ICD-10-CM | POA: Diagnosis not present

## 2017-05-08 DIAGNOSIS — E782 Mixed hyperlipidemia: Secondary | ICD-10-CM | POA: Diagnosis not present

## 2017-05-08 DIAGNOSIS — E039 Hypothyroidism, unspecified: Secondary | ICD-10-CM | POA: Diagnosis not present

## 2017-05-08 DIAGNOSIS — R7303 Prediabetes: Secondary | ICD-10-CM | POA: Diagnosis not present

## 2017-05-09 DIAGNOSIS — M25661 Stiffness of right knee, not elsewhere classified: Secondary | ICD-10-CM | POA: Diagnosis not present

## 2017-05-11 DIAGNOSIS — M25661 Stiffness of right knee, not elsewhere classified: Secondary | ICD-10-CM | POA: Diagnosis not present

## 2017-05-14 DIAGNOSIS — M25661 Stiffness of right knee, not elsewhere classified: Secondary | ICD-10-CM | POA: Diagnosis not present

## 2017-05-16 DIAGNOSIS — M25661 Stiffness of right knee, not elsewhere classified: Secondary | ICD-10-CM | POA: Diagnosis not present

## 2017-05-18 DIAGNOSIS — M25661 Stiffness of right knee, not elsewhere classified: Secondary | ICD-10-CM | POA: Diagnosis not present

## 2017-05-21 DIAGNOSIS — M25661 Stiffness of right knee, not elsewhere classified: Secondary | ICD-10-CM | POA: Diagnosis not present

## 2017-05-22 DIAGNOSIS — R7303 Prediabetes: Secondary | ICD-10-CM | POA: Diagnosis not present

## 2017-05-22 DIAGNOSIS — E782 Mixed hyperlipidemia: Secondary | ICD-10-CM | POA: Diagnosis not present

## 2017-05-22 DIAGNOSIS — M8588 Other specified disorders of bone density and structure, other site: Secondary | ICD-10-CM | POA: Diagnosis not present

## 2017-05-22 DIAGNOSIS — I10 Essential (primary) hypertension: Secondary | ICD-10-CM | POA: Diagnosis not present

## 2017-05-22 DIAGNOSIS — E039 Hypothyroidism, unspecified: Secondary | ICD-10-CM | POA: Diagnosis not present

## 2017-05-23 ENCOUNTER — Other Ambulatory Visit: Payer: Self-pay | Admitting: Family Medicine

## 2017-05-23 DIAGNOSIS — M25661 Stiffness of right knee, not elsewhere classified: Secondary | ICD-10-CM | POA: Diagnosis not present

## 2017-05-23 DIAGNOSIS — M8580 Other specified disorders of bone density and structure, unspecified site: Secondary | ICD-10-CM

## 2017-05-25 DIAGNOSIS — M25661 Stiffness of right knee, not elsewhere classified: Secondary | ICD-10-CM | POA: Diagnosis not present

## 2017-05-28 DIAGNOSIS — M25661 Stiffness of right knee, not elsewhere classified: Secondary | ICD-10-CM | POA: Diagnosis not present

## 2017-05-29 DIAGNOSIS — M1711 Unilateral primary osteoarthritis, right knee: Secondary | ICD-10-CM | POA: Diagnosis not present

## 2017-05-30 DIAGNOSIS — M25661 Stiffness of right knee, not elsewhere classified: Secondary | ICD-10-CM | POA: Diagnosis not present

## 2017-06-01 DIAGNOSIS — M25661 Stiffness of right knee, not elsewhere classified: Secondary | ICD-10-CM | POA: Diagnosis not present

## 2017-06-05 DIAGNOSIS — H40013 Open angle with borderline findings, low risk, bilateral: Secondary | ICD-10-CM | POA: Diagnosis not present

## 2017-07-03 DIAGNOSIS — M25661 Stiffness of right knee, not elsewhere classified: Secondary | ICD-10-CM | POA: Diagnosis not present

## 2017-07-10 ENCOUNTER — Ambulatory Visit
Admission: RE | Admit: 2017-07-10 | Discharge: 2017-07-10 | Disposition: A | Discharge status: Home / Self Care | Payer: PPO | Source: Ambulatory Visit | Attending: Family Medicine | Admitting: Family Medicine

## 2017-07-10 DIAGNOSIS — Z780 Asymptomatic menopausal state: Secondary | ICD-10-CM | POA: Diagnosis not present

## 2017-07-10 DIAGNOSIS — M8580 Other specified disorders of bone density and structure, unspecified site: Secondary | ICD-10-CM

## 2017-07-10 DIAGNOSIS — M85851 Other specified disorders of bone density and structure, right thigh: Secondary | ICD-10-CM | POA: Diagnosis not present

## 2017-10-03 ENCOUNTER — Other Ambulatory Visit: Payer: Self-pay | Admitting: Family Medicine

## 2017-10-03 DIAGNOSIS — Z1231 Encounter for screening mammogram for malignant neoplasm of breast: Secondary | ICD-10-CM

## 2017-11-02 DIAGNOSIS — Z23 Encounter for immunization: Secondary | ICD-10-CM | POA: Diagnosis not present

## 2017-11-27 ENCOUNTER — Ambulatory Visit
Admission: RE | Admit: 2017-11-27 | Discharge: 2017-11-27 | Disposition: A | Discharge status: Home / Self Care | Payer: PPO | Source: Ambulatory Visit | Attending: Family Medicine | Admitting: Family Medicine

## 2017-11-27 DIAGNOSIS — Z1231 Encounter for screening mammogram for malignant neoplasm of breast: Secondary | ICD-10-CM

## 2017-11-27 DIAGNOSIS — R7303 Prediabetes: Secondary | ICD-10-CM | POA: Diagnosis not present

## 2017-11-27 DIAGNOSIS — E782 Mixed hyperlipidemia: Secondary | ICD-10-CM | POA: Diagnosis not present

## 2017-11-27 DIAGNOSIS — E039 Hypothyroidism, unspecified: Secondary | ICD-10-CM | POA: Diagnosis not present

## 2017-11-29 DIAGNOSIS — M25661 Stiffness of right knee, not elsewhere classified: Secondary | ICD-10-CM | POA: Diagnosis not present

## 2017-12-06 DIAGNOSIS — Z01411 Encounter for gynecological examination (general) (routine) with abnormal findings: Secondary | ICD-10-CM | POA: Diagnosis not present

## 2017-12-06 DIAGNOSIS — I10 Essential (primary) hypertension: Secondary | ICD-10-CM | POA: Diagnosis not present

## 2017-12-06 DIAGNOSIS — E039 Hypothyroidism, unspecified: Secondary | ICD-10-CM | POA: Diagnosis not present

## 2017-12-06 DIAGNOSIS — M8588 Other specified disorders of bone density and structure, other site: Secondary | ICD-10-CM | POA: Diagnosis not present

## 2017-12-06 DIAGNOSIS — Z0000 Encounter for general adult medical examination without abnormal findings: Secondary | ICD-10-CM | POA: Diagnosis not present

## 2017-12-06 DIAGNOSIS — E782 Mixed hyperlipidemia: Secondary | ICD-10-CM | POA: Diagnosis not present

## 2017-12-06 DIAGNOSIS — Z1389 Encounter for screening for other disorder: Secondary | ICD-10-CM | POA: Diagnosis not present

## 2017-12-06 DIAGNOSIS — R7303 Prediabetes: Secondary | ICD-10-CM | POA: Diagnosis not present

## 2018-01-10 DIAGNOSIS — Z471 Aftercare following joint replacement surgery: Secondary | ICD-10-CM | POA: Diagnosis not present

## 2018-01-10 DIAGNOSIS — Z96652 Presence of left artificial knee joint: Secondary | ICD-10-CM | POA: Diagnosis not present

## 2018-01-14 DIAGNOSIS — Z01411 Encounter for gynecological examination (general) (routine) with abnormal findings: Secondary | ICD-10-CM | POA: Diagnosis not present

## 2018-02-06 DIAGNOSIS — N898 Other specified noninflammatory disorders of vagina: Secondary | ICD-10-CM | POA: Diagnosis not present

## 2018-02-18 DIAGNOSIS — J029 Acute pharyngitis, unspecified: Secondary | ICD-10-CM | POA: Diagnosis not present

## 2018-02-18 DIAGNOSIS — H66001 Acute suppurative otitis media without spontaneous rupture of ear drum, right ear: Secondary | ICD-10-CM | POA: Diagnosis not present

## 2018-02-25 DIAGNOSIS — H6691 Otitis media, unspecified, right ear: Secondary | ICD-10-CM | POA: Diagnosis not present

## 2018-02-25 DIAGNOSIS — Z6829 Body mass index (BMI) 29.0-29.9, adult: Secondary | ICD-10-CM | POA: Diagnosis not present

## 2018-03-07 DIAGNOSIS — H6691 Otitis media, unspecified, right ear: Secondary | ICD-10-CM | POA: Diagnosis not present

## 2018-03-15 DIAGNOSIS — I10 Essential (primary) hypertension: Secondary | ICD-10-CM | POA: Diagnosis not present

## 2018-03-15 DIAGNOSIS — E039 Hypothyroidism, unspecified: Secondary | ICD-10-CM | POA: Diagnosis not present

## 2018-03-15 DIAGNOSIS — M8588 Other specified disorders of bone density and structure, other site: Secondary | ICD-10-CM | POA: Diagnosis not present

## 2018-03-15 DIAGNOSIS — Z01411 Encounter for gynecological examination (general) (routine) with abnormal findings: Secondary | ICD-10-CM | POA: Diagnosis not present

## 2018-03-15 DIAGNOSIS — Z0000 Encounter for general adult medical examination without abnormal findings: Secondary | ICD-10-CM | POA: Diagnosis not present

## 2018-03-15 DIAGNOSIS — R7303 Prediabetes: Secondary | ICD-10-CM | POA: Diagnosis not present

## 2018-03-15 DIAGNOSIS — E782 Mixed hyperlipidemia: Secondary | ICD-10-CM | POA: Diagnosis not present

## 2018-04-30 DIAGNOSIS — L570 Actinic keratosis: Secondary | ICD-10-CM | POA: Diagnosis not present

## 2018-04-30 DIAGNOSIS — M7130 Other bursal cyst, unspecified site: Secondary | ICD-10-CM | POA: Diagnosis not present

## 2018-04-30 DIAGNOSIS — D2271 Melanocytic nevi of right lower limb, including hip: Secondary | ICD-10-CM | POA: Diagnosis not present

## 2018-04-30 DIAGNOSIS — Z86018 Personal history of other benign neoplasm: Secondary | ICD-10-CM | POA: Diagnosis not present

## 2018-04-30 DIAGNOSIS — L814 Other melanin hyperpigmentation: Secondary | ICD-10-CM | POA: Diagnosis not present

## 2018-04-30 DIAGNOSIS — L821 Other seborrheic keratosis: Secondary | ICD-10-CM | POA: Diagnosis not present

## 2018-04-30 DIAGNOSIS — D225 Melanocytic nevi of trunk: Secondary | ICD-10-CM | POA: Diagnosis not present

## 2018-04-30 DIAGNOSIS — Z23 Encounter for immunization: Secondary | ICD-10-CM | POA: Diagnosis not present

## 2018-07-16 DIAGNOSIS — H2513 Age-related nuclear cataract, bilateral: Secondary | ICD-10-CM | POA: Diagnosis not present

## 2018-07-16 DIAGNOSIS — H40053 Ocular hypertension, bilateral: Secondary | ICD-10-CM | POA: Diagnosis not present

## 2018-09-30 DIAGNOSIS — L570 Actinic keratosis: Secondary | ICD-10-CM | POA: Diagnosis not present

## 2018-09-30 DIAGNOSIS — T1490XA Injury, unspecified, initial encounter: Secondary | ICD-10-CM | POA: Diagnosis not present

## 2018-10-16 ENCOUNTER — Other Ambulatory Visit: Payer: Self-pay | Admitting: Family Medicine

## 2018-10-16 DIAGNOSIS — Z1231 Encounter for screening mammogram for malignant neoplasm of breast: Secondary | ICD-10-CM

## 2018-12-04 ENCOUNTER — Ambulatory Visit
Admission: RE | Admit: 2018-12-04 | Discharge: 2018-12-04 | Disposition: A | Discharge status: Home / Self Care | Payer: PPO | Source: Ambulatory Visit | Attending: Family Medicine | Admitting: Family Medicine

## 2018-12-04 ENCOUNTER — Other Ambulatory Visit: Payer: Self-pay

## 2018-12-04 DIAGNOSIS — Z1231 Encounter for screening mammogram for malignant neoplasm of breast: Secondary | ICD-10-CM

## 2018-12-17 DIAGNOSIS — I10 Essential (primary) hypertension: Secondary | ICD-10-CM | POA: Diagnosis not present

## 2018-12-17 DIAGNOSIS — E782 Mixed hyperlipidemia: Secondary | ICD-10-CM | POA: Diagnosis not present

## 2018-12-17 DIAGNOSIS — E039 Hypothyroidism, unspecified: Secondary | ICD-10-CM | POA: Diagnosis not present

## 2018-12-17 DIAGNOSIS — R7303 Prediabetes: Secondary | ICD-10-CM | POA: Diagnosis not present

## 2018-12-19 DIAGNOSIS — R7303 Prediabetes: Secondary | ICD-10-CM | POA: Diagnosis not present

## 2018-12-19 DIAGNOSIS — I10 Essential (primary) hypertension: Secondary | ICD-10-CM | POA: Diagnosis not present

## 2018-12-19 DIAGNOSIS — E039 Hypothyroidism, unspecified: Secondary | ICD-10-CM | POA: Diagnosis not present

## 2018-12-19 DIAGNOSIS — E782 Mixed hyperlipidemia: Secondary | ICD-10-CM | POA: Diagnosis not present

## 2018-12-25 DIAGNOSIS — Z800 Family history of malignant neoplasm of digestive organs: Secondary | ICD-10-CM | POA: Diagnosis not present

## 2018-12-25 DIAGNOSIS — R1310 Dysphagia, unspecified: Secondary | ICD-10-CM | POA: Diagnosis not present

## 2018-12-25 DIAGNOSIS — Z1211 Encounter for screening for malignant neoplasm of colon: Secondary | ICD-10-CM | POA: Diagnosis not present

## 2019-01-29 DIAGNOSIS — Z1159 Encounter for screening for other viral diseases: Secondary | ICD-10-CM | POA: Diagnosis not present

## 2019-02-03 DIAGNOSIS — Z1211 Encounter for screening for malignant neoplasm of colon: Secondary | ICD-10-CM | POA: Diagnosis not present

## 2019-02-03 DIAGNOSIS — K5730 Diverticulosis of large intestine without perforation or abscess without bleeding: Secondary | ICD-10-CM | POA: Diagnosis not present

## 2019-02-03 DIAGNOSIS — K648 Other hemorrhoids: Secondary | ICD-10-CM | POA: Diagnosis not present

## 2019-02-03 DIAGNOSIS — D122 Benign neoplasm of ascending colon: Secondary | ICD-10-CM | POA: Diagnosis not present

## 2019-02-03 DIAGNOSIS — Z800 Family history of malignant neoplasm of digestive organs: Secondary | ICD-10-CM | POA: Diagnosis not present

## 2019-02-03 DIAGNOSIS — Q438 Other specified congenital malformations of intestine: Secondary | ICD-10-CM | POA: Diagnosis not present

## 2019-02-05 DIAGNOSIS — D122 Benign neoplasm of ascending colon: Secondary | ICD-10-CM | POA: Diagnosis not present

## 2019-03-21 ENCOUNTER — Ambulatory Visit: Payer: PPO | Attending: Internal Medicine

## 2019-03-21 DIAGNOSIS — Z23 Encounter for immunization: Secondary | ICD-10-CM | POA: Insufficient documentation

## 2019-03-21 NOTE — Progress Notes (Signed)
   Covid-19 Vaccination Clinic  Name:  Krystal Kent    MRN: [id number] DOB: [birth year]  03/21/2019  Krystal Kent was observed post Covid-19 immunization for 15 minutes without incidence. She was provided with Vaccine Information Sheet and instruction to access the V-Safe system.   Krystal Kent was instructed to call 911 with any severe reactions post vaccine: Marland Kitchen Difficulty breathing  . Swelling of your face and throat  . A fast heartbeat  . A bad rash all over your body  . Dizziness and weakness    Immunizations Administered    Name Date Dose VIS Date Route   Pfizer COVID-19 Vaccine 03/21/2019  8:34 AM 0.3 mL 02/07/2019 Intramuscular   Manufacturer: [place]   Lot: [id number]   [place]: [id number]

## 2019-03-26 DIAGNOSIS — E782 Mixed hyperlipidemia: Secondary | ICD-10-CM | POA: Diagnosis not present

## 2019-03-26 DIAGNOSIS — I10 Essential (primary) hypertension: Secondary | ICD-10-CM | POA: Diagnosis not present

## 2019-03-26 DIAGNOSIS — E039 Hypothyroidism, unspecified: Secondary | ICD-10-CM | POA: Diagnosis not present

## 2019-04-08 ENCOUNTER — Ambulatory Visit: Payer: PPO

## 2019-04-10 ENCOUNTER — Ambulatory Visit: Payer: PPO | Attending: Internal Medicine

## 2019-04-10 DIAGNOSIS — Z23 Encounter for immunization: Secondary | ICD-10-CM | POA: Insufficient documentation

## 2019-04-10 NOTE — Progress Notes (Signed)
   Covid-19 Vaccination Clinic  Name:  EVIANNA SPOO    MRN: [id number] DOB: 08-12-1945  04/10/2019  Ms. Preval was observed post Covid-19 immunization for 15 minutes without incidence. She was provided with Vaccine Information Sheet and instruction to access the V-Safe system.   Ms. Ridling was instructed to call 911 with any severe reactions post vaccine: Marland Kitchen Difficulty breathing  . Swelling of your face and throat  . A fast heartbeat  . A bad rash all over your body  . Dizziness and weakness    Immunizations Administered    Name Date Dose VIS Date Route   Pfizer COVID-19 Vaccine 04/10/2019 12:34 PM 0.3 mL 02/07/2019 Intramuscular   Manufacturer: [place]   Lot: [id number]   [place]: [id number]

## 2019-05-06 DIAGNOSIS — D225 Melanocytic nevi of trunk: Secondary | ICD-10-CM | POA: Diagnosis not present

## 2019-05-06 DIAGNOSIS — L814 Other melanin hyperpigmentation: Secondary | ICD-10-CM | POA: Diagnosis not present

## 2019-05-06 DIAGNOSIS — L918 Other hypertrophic disorders of the skin: Secondary | ICD-10-CM | POA: Diagnosis not present

## 2019-05-06 DIAGNOSIS — L821 Other seborrheic keratosis: Secondary | ICD-10-CM | POA: Diagnosis not present

## 2019-05-06 DIAGNOSIS — L578 Other skin changes due to chronic exposure to nonionizing radiation: Secondary | ICD-10-CM | POA: Diagnosis not present

## 2019-05-06 DIAGNOSIS — D2271 Melanocytic nevi of right lower limb, including hip: Secondary | ICD-10-CM | POA: Diagnosis not present

## 2019-05-06 DIAGNOSIS — L570 Actinic keratosis: Secondary | ICD-10-CM | POA: Diagnosis not present

## 2019-05-06 DIAGNOSIS — Z86018 Personal history of other benign neoplasm: Secondary | ICD-10-CM | POA: Diagnosis not present

## 2019-05-21 DIAGNOSIS — E782 Mixed hyperlipidemia: Secondary | ICD-10-CM | POA: Diagnosis not present

## 2019-05-21 DIAGNOSIS — E039 Hypothyroidism, unspecified: Secondary | ICD-10-CM | POA: Diagnosis not present

## 2019-05-21 DIAGNOSIS — I10 Essential (primary) hypertension: Secondary | ICD-10-CM | POA: Diagnosis not present

## 2019-05-26 DIAGNOSIS — Z1159 Encounter for screening for other viral diseases: Secondary | ICD-10-CM | POA: Diagnosis not present

## 2019-05-26 DIAGNOSIS — E782 Mixed hyperlipidemia: Secondary | ICD-10-CM | POA: Diagnosis not present

## 2019-05-26 DIAGNOSIS — R7303 Prediabetes: Secondary | ICD-10-CM | POA: Diagnosis not present

## 2019-05-26 DIAGNOSIS — I10 Essential (primary) hypertension: Secondary | ICD-10-CM | POA: Diagnosis not present

## 2019-05-26 DIAGNOSIS — E039 Hypothyroidism, unspecified: Secondary | ICD-10-CM | POA: Diagnosis not present

## 2019-06-03 DIAGNOSIS — R7303 Prediabetes: Secondary | ICD-10-CM | POA: Diagnosis not present

## 2019-06-03 DIAGNOSIS — I10 Essential (primary) hypertension: Secondary | ICD-10-CM | POA: Diagnosis not present

## 2019-06-03 DIAGNOSIS — E039 Hypothyroidism, unspecified: Secondary | ICD-10-CM | POA: Diagnosis not present

## 2019-06-03 DIAGNOSIS — E782 Mixed hyperlipidemia: Secondary | ICD-10-CM | POA: Diagnosis not present

## 2019-06-03 DIAGNOSIS — M8588 Other specified disorders of bone density and structure, other site: Secondary | ICD-10-CM | POA: Diagnosis not present

## 2019-06-03 DIAGNOSIS — Z0000 Encounter for general adult medical examination without abnormal findings: Secondary | ICD-10-CM | POA: Diagnosis not present

## 2019-06-04 ENCOUNTER — Other Ambulatory Visit: Payer: Self-pay | Admitting: Family Medicine

## 2019-06-04 DIAGNOSIS — M8580 Other specified disorders of bone density and structure, unspecified site: Secondary | ICD-10-CM

## 2019-07-18 DIAGNOSIS — E039 Hypothyroidism, unspecified: Secondary | ICD-10-CM | POA: Diagnosis not present

## 2019-07-18 DIAGNOSIS — E782 Mixed hyperlipidemia: Secondary | ICD-10-CM | POA: Diagnosis not present

## 2019-07-18 DIAGNOSIS — I10 Essential (primary) hypertension: Secondary | ICD-10-CM | POA: Diagnosis not present

## 2019-07-22 DIAGNOSIS — H2513 Age-related nuclear cataract, bilateral: Secondary | ICD-10-CM | POA: Diagnosis not present

## 2019-07-24 DIAGNOSIS — L820 Inflamed seborrheic keratosis: Secondary | ICD-10-CM | POA: Diagnosis not present

## 2019-07-24 DIAGNOSIS — L309 Dermatitis, unspecified: Secondary | ICD-10-CM | POA: Diagnosis not present

## 2019-08-04 ENCOUNTER — Other Ambulatory Visit: Payer: Self-pay

## 2019-08-04 ENCOUNTER — Ambulatory Visit
Admission: RE | Admit: 2019-08-04 | Discharge: 2019-08-04 | Disposition: A | Discharge status: Home / Self Care | Payer: PPO | Source: Ambulatory Visit | Attending: Family Medicine | Admitting: Family Medicine

## 2019-08-04 DIAGNOSIS — Z780 Asymptomatic menopausal state: Secondary | ICD-10-CM | POA: Diagnosis not present

## 2019-08-04 DIAGNOSIS — M8580 Other specified disorders of bone density and structure, unspecified site: Secondary | ICD-10-CM

## 2019-08-04 DIAGNOSIS — M85851 Other specified disorders of bone density and structure, right thigh: Secondary | ICD-10-CM | POA: Diagnosis not present

## 2019-10-31 ENCOUNTER — Other Ambulatory Visit: Payer: Self-pay | Admitting: Family Medicine

## 2019-10-31 DIAGNOSIS — Z1231 Encounter for screening mammogram for malignant neoplasm of breast: Secondary | ICD-10-CM

## 2019-11-04 ENCOUNTER — Ambulatory Visit: Payer: PPO | Attending: Internal Medicine

## 2019-11-04 DIAGNOSIS — Z23 Encounter for immunization: Secondary | ICD-10-CM

## 2019-11-04 NOTE — Progress Notes (Signed)
   Covid-19 Vaccination Clinic  Name:  Raphael Espe    MRN: [id number] DOB: [birth year]  11/04/2019  Ms. Boehning was observed post Covid-19 immunization for 15 minutes without incident. She was provided with Vaccine Information Sheet and instruction to access the V-Safe system.   Ms. Shankman was instructed to call 911 with any severe reactions post vaccine: Marland Kitchen Difficulty breathing  . Swelling of face and throat  . A fast heartbeat  . A bad rash all over body  . Dizziness and weakness

## 2019-12-16 DIAGNOSIS — Z0000 Encounter for general adult medical examination without abnormal findings: Secondary | ICD-10-CM | POA: Diagnosis not present

## 2019-12-16 DIAGNOSIS — M8588 Other specified disorders of bone density and structure, other site: Secondary | ICD-10-CM | POA: Diagnosis not present

## 2019-12-16 DIAGNOSIS — I10 Essential (primary) hypertension: Secondary | ICD-10-CM | POA: Diagnosis not present

## 2019-12-16 DIAGNOSIS — R7303 Prediabetes: Secondary | ICD-10-CM | POA: Diagnosis not present

## 2019-12-16 DIAGNOSIS — E782 Mixed hyperlipidemia: Secondary | ICD-10-CM | POA: Diagnosis not present

## 2019-12-16 DIAGNOSIS — E039 Hypothyroidism, unspecified: Secondary | ICD-10-CM | POA: Diagnosis not present

## 2019-12-23 DIAGNOSIS — E782 Mixed hyperlipidemia: Secondary | ICD-10-CM | POA: Diagnosis not present

## 2019-12-23 DIAGNOSIS — R7303 Prediabetes: Secondary | ICD-10-CM | POA: Diagnosis not present

## 2019-12-23 DIAGNOSIS — I10 Essential (primary) hypertension: Secondary | ICD-10-CM | POA: Diagnosis not present

## 2019-12-23 DIAGNOSIS — E039 Hypothyroidism, unspecified: Secondary | ICD-10-CM | POA: Diagnosis not present

## 2019-12-30 ENCOUNTER — Ambulatory Visit: Payer: PPO

## 2020-01-06 ENCOUNTER — Ambulatory Visit
Admission: RE | Admit: 2020-01-06 | Discharge: 2020-01-06 | Disposition: A | Discharge status: Home / Self Care | Payer: PPO | Source: Ambulatory Visit | Attending: Family Medicine | Admitting: Family Medicine

## 2020-01-06 ENCOUNTER — Other Ambulatory Visit: Payer: Self-pay

## 2020-01-06 DIAGNOSIS — Z1231 Encounter for screening mammogram for malignant neoplasm of breast: Secondary | ICD-10-CM | POA: Diagnosis not present

## 2020-05-04 DIAGNOSIS — L578 Other skin changes due to chronic exposure to nonionizing radiation: Secondary | ICD-10-CM | POA: Diagnosis not present

## 2020-05-04 DIAGNOSIS — L814 Other melanin hyperpigmentation: Secondary | ICD-10-CM | POA: Diagnosis not present

## 2020-05-04 DIAGNOSIS — Z86018 Personal history of other benign neoplasm: Secondary | ICD-10-CM | POA: Diagnosis not present

## 2020-05-04 DIAGNOSIS — L821 Other seborrheic keratosis: Secondary | ICD-10-CM | POA: Diagnosis not present

## 2020-05-04 DIAGNOSIS — D225 Melanocytic nevi of trunk: Secondary | ICD-10-CM | POA: Diagnosis not present

## 2020-05-04 DIAGNOSIS — D2271 Melanocytic nevi of right lower limb, including hip: Secondary | ICD-10-CM | POA: Diagnosis not present

## 2020-06-01 DIAGNOSIS — E039 Hypothyroidism, unspecified: Secondary | ICD-10-CM | POA: Diagnosis not present

## 2020-06-01 DIAGNOSIS — I10 Essential (primary) hypertension: Secondary | ICD-10-CM | POA: Diagnosis not present

## 2020-06-01 DIAGNOSIS — E782 Mixed hyperlipidemia: Secondary | ICD-10-CM | POA: Diagnosis not present

## 2020-06-01 DIAGNOSIS — R7303 Prediabetes: Secondary | ICD-10-CM | POA: Diagnosis not present

## 2020-06-08 DIAGNOSIS — I10 Essential (primary) hypertension: Secondary | ICD-10-CM | POA: Diagnosis not present

## 2020-06-08 DIAGNOSIS — E039 Hypothyroidism, unspecified: Secondary | ICD-10-CM | POA: Diagnosis not present

## 2020-06-08 DIAGNOSIS — R7303 Prediabetes: Secondary | ICD-10-CM | POA: Diagnosis not present

## 2020-06-08 DIAGNOSIS — Z0000 Encounter for general adult medical examination without abnormal findings: Secondary | ICD-10-CM | POA: Diagnosis not present

## 2020-06-08 DIAGNOSIS — E782 Mixed hyperlipidemia: Secondary | ICD-10-CM | POA: Diagnosis not present

## 2020-07-20 ENCOUNTER — Other Ambulatory Visit: Payer: Self-pay

## 2020-07-20 ENCOUNTER — Other Ambulatory Visit (HOSPITAL_BASED_OUTPATIENT_CLINIC_OR_DEPARTMENT_OTHER): Payer: Self-pay

## 2020-07-20 ENCOUNTER — Ambulatory Visit: Payer: PPO | Attending: Internal Medicine

## 2020-07-20 DIAGNOSIS — Z23 Encounter for immunization: Secondary | ICD-10-CM

## 2020-07-20 MED ORDER — PFIZER-BIONT COVID-19 VAC-TRIS 30 MCG/0.3ML IM SUSP
INTRAMUSCULAR | 0 refills | Status: AC
  Filled 2020-07-20: qty 0.3, 1d supply, fill #0

## 2020-07-20 NOTE — Progress Notes (Signed)
   Covid-19 Vaccination Clinic  Name:  Krystal Kent    MRN: [id number] DOB: 29-May-1945  07/20/2020  Ms. Krystal Kent was observed post Covid-19 immunization for 15 minutes without incident. She was provided with Vaccine Information Sheet and instruction to access the V-Safe system.   Ms. Krystal Kent was instructed to call 911 with any severe reactions post vaccine: Marland Kitchen Difficulty breathing  . Swelling of face and throat  . A fast heartbeat  . A bad rash all over body  . Dizziness and weakness   Immunizations Administered    Name Date Dose VIS Date Route   PFIZER Comrnaty(Gray TOP) Covid-19 Vaccine 07/20/2020 10:33 AM 0.3 mL 02/05/2020 Intramuscular   Manufacturer: [employer]   Lot: [id number]   [place]: [phone]

## 2020-07-27 DIAGNOSIS — H5203 Hypermetropia, bilateral: Secondary | ICD-10-CM | POA: Diagnosis not present

## 2020-07-27 DIAGNOSIS — H2513 Age-related nuclear cataract, bilateral: Secondary | ICD-10-CM | POA: Diagnosis not present

## 2020-11-22 ENCOUNTER — Other Ambulatory Visit: Payer: Self-pay | Admitting: Family Medicine

## 2020-11-22 DIAGNOSIS — Z1231 Encounter for screening mammogram for malignant neoplasm of breast: Secondary | ICD-10-CM

## 2020-11-25 ENCOUNTER — Other Ambulatory Visit (HOSPITAL_BASED_OUTPATIENT_CLINIC_OR_DEPARTMENT_OTHER): Payer: Self-pay

## 2020-12-02 ENCOUNTER — Other Ambulatory Visit (HOSPITAL_BASED_OUTPATIENT_CLINIC_OR_DEPARTMENT_OTHER): Payer: Self-pay

## 2020-12-07 ENCOUNTER — Other Ambulatory Visit (HOSPITAL_BASED_OUTPATIENT_CLINIC_OR_DEPARTMENT_OTHER): Payer: Self-pay

## 2020-12-07 ENCOUNTER — Ambulatory Visit: Payer: PPO | Attending: Internal Medicine

## 2020-12-07 DIAGNOSIS — Z23 Encounter for immunization: Secondary | ICD-10-CM

## 2020-12-07 MED ORDER — INFLUENZA VAC A&B SA ADJ QUAD 0.5 ML IM PRSY
PREFILLED_SYRINGE | INTRAMUSCULAR | 0 refills | Status: AC
  Filled 2020-12-07: qty 0.5, 1d supply, fill #0

## 2020-12-07 MED ORDER — PFIZER COVID-19 VAC BIVALENT 30 MCG/0.3ML IM SUSP
INTRAMUSCULAR | 0 refills | Status: AC
  Filled 2020-12-07: qty 0.3, 1d supply, fill #0

## 2020-12-07 NOTE — Progress Notes (Signed)
   Covid-19 Vaccination Clinic  Name:  Krystal Kent    MRN: [id number] DOB: 04/02/1945  12/07/2020  [name] was observed post Covid-19 immunization for 15 minutes without incident. She was provided with Vaccine Information Sheet and instruction to access the V-Safe system.   [name] was instructed to call 911 with any severe reactions post vaccine: Difficulty breathing  Swelling of face and throat  A fast heartbeat  A bad rash all over body  Dizziness and weakness

## 2020-12-14 DIAGNOSIS — R7303 Prediabetes: Secondary | ICD-10-CM | POA: Diagnosis not present

## 2020-12-14 DIAGNOSIS — Z0000 Encounter for general adult medical examination without abnormal findings: Secondary | ICD-10-CM | POA: Diagnosis not present

## 2020-12-14 DIAGNOSIS — Z13220 Encounter for screening for lipoid disorders: Secondary | ICD-10-CM | POA: Diagnosis not present

## 2021-01-18 DIAGNOSIS — R102 Pelvic and perineal pain: Secondary | ICD-10-CM | POA: Diagnosis not present

## 2021-01-18 DIAGNOSIS — I10 Essential (primary) hypertension: Secondary | ICD-10-CM | POA: Diagnosis not present

## 2021-01-18 DIAGNOSIS — R7303 Prediabetes: Secondary | ICD-10-CM | POA: Diagnosis not present

## 2021-01-25 ENCOUNTER — Ambulatory Visit
Admission: RE | Admit: 2021-01-25 | Discharge: 2021-01-25 | Disposition: A | Discharge status: Home / Self Care | Payer: PPO | Source: Ambulatory Visit | Attending: Family Medicine | Admitting: Family Medicine

## 2021-01-25 DIAGNOSIS — Z1231 Encounter for screening mammogram for malignant neoplasm of breast: Secondary | ICD-10-CM

## 2021-05-10 DIAGNOSIS — D2271 Melanocytic nevi of right lower limb, including hip: Secondary | ICD-10-CM | POA: Diagnosis not present

## 2021-05-10 DIAGNOSIS — L578 Other skin changes due to chronic exposure to nonionizing radiation: Secondary | ICD-10-CM | POA: Diagnosis not present

## 2021-05-10 DIAGNOSIS — Z23 Encounter for immunization: Secondary | ICD-10-CM | POA: Diagnosis not present

## 2021-05-10 DIAGNOSIS — L821 Other seborrheic keratosis: Secondary | ICD-10-CM | POA: Diagnosis not present

## 2021-05-10 DIAGNOSIS — L570 Actinic keratosis: Secondary | ICD-10-CM | POA: Diagnosis not present

## 2021-05-10 DIAGNOSIS — L814 Other melanin hyperpigmentation: Secondary | ICD-10-CM | POA: Diagnosis not present

## 2021-05-10 DIAGNOSIS — D225 Melanocytic nevi of trunk: Secondary | ICD-10-CM | POA: Diagnosis not present

## 2021-05-10 DIAGNOSIS — Z86018 Personal history of other benign neoplasm: Secondary | ICD-10-CM | POA: Diagnosis not present

## 2021-06-07 DIAGNOSIS — R7303 Prediabetes: Secondary | ICD-10-CM | POA: Diagnosis not present

## 2021-06-07 DIAGNOSIS — I10 Essential (primary) hypertension: Secondary | ICD-10-CM | POA: Diagnosis not present

## 2021-06-07 DIAGNOSIS — E039 Hypothyroidism, unspecified: Secondary | ICD-10-CM | POA: Diagnosis not present

## 2021-06-14 DIAGNOSIS — E039 Hypothyroidism, unspecified: Secondary | ICD-10-CM | POA: Diagnosis not present

## 2021-06-14 DIAGNOSIS — Z1389 Encounter for screening for other disorder: Secondary | ICD-10-CM | POA: Diagnosis not present

## 2021-06-14 DIAGNOSIS — Z6830 Body mass index (BMI) 30.0-30.9, adult: Secondary | ICD-10-CM | POA: Diagnosis not present

## 2021-06-14 DIAGNOSIS — M8588 Other specified disorders of bone density and structure, other site: Secondary | ICD-10-CM | POA: Diagnosis not present

## 2021-06-14 DIAGNOSIS — E1169 Type 2 diabetes mellitus with other specified complication: Secondary | ICD-10-CM | POA: Diagnosis not present

## 2021-06-14 DIAGNOSIS — Z0000 Encounter for general adult medical examination without abnormal findings: Secondary | ICD-10-CM | POA: Diagnosis not present

## 2021-06-14 DIAGNOSIS — I10 Essential (primary) hypertension: Secondary | ICD-10-CM | POA: Diagnosis not present

## 2021-08-16 DIAGNOSIS — H02834 Dermatochalasis of left upper eyelid: Secondary | ICD-10-CM | POA: Diagnosis not present

## 2021-08-16 DIAGNOSIS — H52203 Unspecified astigmatism, bilateral: Secondary | ICD-10-CM | POA: Diagnosis not present

## 2021-08-16 DIAGNOSIS — H02831 Dermatochalasis of right upper eyelid: Secondary | ICD-10-CM | POA: Diagnosis not present

## 2021-08-16 DIAGNOSIS — H2513 Age-related nuclear cataract, bilateral: Secondary | ICD-10-CM | POA: Diagnosis not present

## 2021-08-31 DIAGNOSIS — H00015 Hordeolum externum left lower eyelid: Secondary | ICD-10-CM | POA: Diagnosis not present

## 2021-08-31 DIAGNOSIS — R220 Localized swelling, mass and lump, head: Secondary | ICD-10-CM | POA: Diagnosis not present

## 2021-08-31 DIAGNOSIS — L821 Other seborrheic keratosis: Secondary | ICD-10-CM | POA: Diagnosis not present

## 2021-09-30 ENCOUNTER — Other Ambulatory Visit (HOSPITAL_BASED_OUTPATIENT_CLINIC_OR_DEPARTMENT_OTHER): Payer: Self-pay

## 2021-09-30 MED ORDER — PREVNAR 20 0.5 ML IM SUSY
PREFILLED_SYRINGE | INTRAMUSCULAR | 0 refills | Status: AC
  Filled 2021-09-30: qty 0.5, 1d supply, fill #0

## 2021-11-04 ENCOUNTER — Other Ambulatory Visit (HOSPITAL_BASED_OUTPATIENT_CLINIC_OR_DEPARTMENT_OTHER): Payer: Self-pay

## 2021-11-04 MED ORDER — AREXVY 120 MCG/0.5ML IM SUSR
INTRAMUSCULAR | 0 refills | Status: AC
  Filled 2021-11-04: qty 0.5, 1d supply, fill #0

## 2021-12-06 DIAGNOSIS — E039 Hypothyroidism, unspecified: Secondary | ICD-10-CM | POA: Diagnosis not present

## 2021-12-06 DIAGNOSIS — E1169 Type 2 diabetes mellitus with other specified complication: Secondary | ICD-10-CM | POA: Diagnosis not present

## 2021-12-08 ENCOUNTER — Other Ambulatory Visit (HOSPITAL_BASED_OUTPATIENT_CLINIC_OR_DEPARTMENT_OTHER): Payer: Self-pay

## 2021-12-08 MED ORDER — COVID-19 MRNA 2023-2024 VACCINE (COMIRNATY) 0.3 ML INJECTION
INTRAMUSCULAR | 0 refills | Status: AC
  Filled 2021-12-08: qty 0.3, 1d supply, fill #0

## 2021-12-08 MED ORDER — INFLUENZA VAC A&B SA ADJ QUAD 0.5 ML IM PRSY
PREFILLED_SYRINGE | INTRAMUSCULAR | 0 refills | Status: AC
  Filled 2021-12-08: qty 0.5, 1d supply, fill #0

## 2021-12-12 ENCOUNTER — Other Ambulatory Visit: Payer: Self-pay | Admitting: Family Medicine

## 2021-12-12 DIAGNOSIS — Z1231 Encounter for screening mammogram for malignant neoplasm of breast: Secondary | ICD-10-CM

## 2021-12-13 DIAGNOSIS — I10 Essential (primary) hypertension: Secondary | ICD-10-CM | POA: Diagnosis not present

## 2021-12-13 DIAGNOSIS — E1169 Type 2 diabetes mellitus with other specified complication: Secondary | ICD-10-CM | POA: Diagnosis not present

## 2021-12-13 DIAGNOSIS — E039 Hypothyroidism, unspecified: Secondary | ICD-10-CM | POA: Diagnosis not present

## 2021-12-13 DIAGNOSIS — Z6830 Body mass index (BMI) 30.0-30.9, adult: Secondary | ICD-10-CM | POA: Diagnosis not present

## 2021-12-13 DIAGNOSIS — M8588 Other specified disorders of bone density and structure, other site: Secondary | ICD-10-CM | POA: Diagnosis not present

## 2022-01-28 ENCOUNTER — Other Ambulatory Visit (HOSPITAL_BASED_OUTPATIENT_CLINIC_OR_DEPARTMENT_OTHER): Payer: Self-pay

## 2022-01-31 ENCOUNTER — Ambulatory Visit
Admission: RE | Admit: 2022-01-31 | Discharge: 2022-01-31 | Disposition: A | Discharge status: Home / Self Care | Payer: PPO | Source: Ambulatory Visit | Attending: Family Medicine | Admitting: Family Medicine

## 2022-01-31 DIAGNOSIS — Z1231 Encounter for screening mammogram for malignant neoplasm of breast: Secondary | ICD-10-CM | POA: Diagnosis not present

## 2022-06-06 DIAGNOSIS — L578 Other skin changes due to chronic exposure to nonionizing radiation: Secondary | ICD-10-CM | POA: Diagnosis not present

## 2022-06-06 DIAGNOSIS — Z86018 Personal history of other benign neoplasm: Secondary | ICD-10-CM | POA: Diagnosis not present

## 2022-06-06 DIAGNOSIS — D225 Melanocytic nevi of trunk: Secondary | ICD-10-CM | POA: Diagnosis not present

## 2022-06-06 DIAGNOSIS — L570 Actinic keratosis: Secondary | ICD-10-CM | POA: Diagnosis not present

## 2022-06-06 DIAGNOSIS — D2271 Melanocytic nevi of right lower limb, including hip: Secondary | ICD-10-CM | POA: Diagnosis not present

## 2022-06-06 DIAGNOSIS — L84 Corns and callosities: Secondary | ICD-10-CM | POA: Diagnosis not present

## 2022-06-06 DIAGNOSIS — L814 Other melanin hyperpigmentation: Secondary | ICD-10-CM | POA: Diagnosis not present

## 2022-06-06 DIAGNOSIS — L821 Other seborrheic keratosis: Secondary | ICD-10-CM | POA: Diagnosis not present

## 2022-06-14 DIAGNOSIS — E782 Mixed hyperlipidemia: Secondary | ICD-10-CM | POA: Diagnosis not present

## 2022-06-14 DIAGNOSIS — E039 Hypothyroidism, unspecified: Secondary | ICD-10-CM | POA: Diagnosis not present

## 2022-06-20 DIAGNOSIS — E1169 Type 2 diabetes mellitus with other specified complication: Secondary | ICD-10-CM | POA: Diagnosis not present

## 2022-06-20 DIAGNOSIS — Z1389 Encounter for screening for other disorder: Secondary | ICD-10-CM | POA: Diagnosis not present

## 2022-06-20 DIAGNOSIS — I10 Essential (primary) hypertension: Secondary | ICD-10-CM | POA: Diagnosis not present

## 2022-06-20 DIAGNOSIS — E039 Hypothyroidism, unspecified: Secondary | ICD-10-CM | POA: Diagnosis not present

## 2022-06-20 DIAGNOSIS — Z6830 Body mass index (BMI) 30.0-30.9, adult: Secondary | ICD-10-CM | POA: Diagnosis not present

## 2022-06-20 DIAGNOSIS — Z0000 Encounter for general adult medical examination without abnormal findings: Secondary | ICD-10-CM | POA: Diagnosis not present

## 2022-06-20 DIAGNOSIS — E119 Type 2 diabetes mellitus without complications: Secondary | ICD-10-CM | POA: Diagnosis not present

## 2022-06-20 DIAGNOSIS — M8588 Other specified disorders of bone density and structure, other site: Secondary | ICD-10-CM | POA: Diagnosis not present

## 2022-07-25 ENCOUNTER — Ambulatory Visit
Admission: RE | Admit: 2022-07-25 | Discharge: 2022-07-25 | Disposition: A | Discharge status: Home / Self Care | Payer: PPO | Source: Ambulatory Visit | Attending: Family Medicine | Admitting: Family Medicine

## 2022-07-25 ENCOUNTER — Other Ambulatory Visit: Payer: Self-pay | Admitting: Family Medicine

## 2022-07-25 DIAGNOSIS — M25531 Pain in right wrist: Secondary | ICD-10-CM

## 2022-07-25 DIAGNOSIS — S52511A Displaced fracture of right radial styloid process, initial encounter for closed fracture: Secondary | ICD-10-CM | POA: Diagnosis not present

## 2022-07-25 DIAGNOSIS — Z6830 Body mass index (BMI) 30.0-30.9, adult: Secondary | ICD-10-CM | POA: Diagnosis not present

## 2022-07-25 DIAGNOSIS — Z9181 History of falling: Secondary | ICD-10-CM | POA: Diagnosis not present

## 2022-07-28 DIAGNOSIS — M25531 Pain in right wrist: Secondary | ICD-10-CM | POA: Diagnosis not present

## 2022-08-08 DIAGNOSIS — M25531 Pain in right wrist: Secondary | ICD-10-CM | POA: Diagnosis not present

## 2022-08-13 ENCOUNTER — Other Ambulatory Visit: Payer: Self-pay

## 2022-08-13 ENCOUNTER — Encounter (HOSPITAL_BASED_OUTPATIENT_CLINIC_OR_DEPARTMENT_OTHER): Payer: Self-pay

## 2022-08-13 DIAGNOSIS — Z5321 Procedure and treatment not carried out due to patient leaving prior to being seen by health care provider: Secondary | ICD-10-CM | POA: Insufficient documentation

## 2022-08-13 DIAGNOSIS — R300 Dysuria: Secondary | ICD-10-CM | POA: Insufficient documentation

## 2022-08-13 LAB — URINALYSIS, ROUTINE W REFLEX MICROSCOPIC
Bacteria, UA: NONE SEEN
Bilirubin Urine: NEGATIVE
Glucose, UA: NEGATIVE mg/dL
Ketones, ur: NEGATIVE mg/dL
Nitrite: NEGATIVE
Protein, ur: 30 mg/dL — AB
RBC / HPF: >50 RBC/hpf (ref 0–5)
Specific Gravity, Urine: 1.022 (ref 1.005–1.030)
WBC, UA: >50 WBC/hpf (ref 0–5)
pH: 5.5 (ref 5.0–8.0)

## 2022-08-13 NOTE — ED Triage Notes (Signed)
POV from home, A&O x 4, GCS 15, amb to triage  C/o urgency, painful urination, dysuria and blood in urine that began today

## 2022-08-14 ENCOUNTER — Emergency Department (HOSPITAL_BASED_OUTPATIENT_CLINIC_OR_DEPARTMENT_OTHER)
Admission: EM | Admit: 2022-08-14 | Discharge: 2022-08-14 | Discharge status: Against Medical Advice | Payer: PPO | Attending: Emergency Medicine | Admitting: Emergency Medicine

## 2022-08-14 DIAGNOSIS — Z6830 Body mass index (BMI) 30.0-30.9, adult: Secondary | ICD-10-CM | POA: Diagnosis not present

## 2022-08-14 DIAGNOSIS — N3001 Acute cystitis with hematuria: Secondary | ICD-10-CM | POA: Diagnosis not present

## 2022-08-14 NOTE — ED Notes (Signed)
This RN called to lobby about patient wait times, pt sts that she does not want to wait any longer and will follow up with her PCP. Pt left ED amb with husband.

## 2022-08-15 LAB — URINE CULTURE: Culture: NO GROWTH

## 2022-08-18 DIAGNOSIS — H5203 Hypermetropia, bilateral: Secondary | ICD-10-CM | POA: Diagnosis not present

## 2022-08-18 DIAGNOSIS — H2513 Age-related nuclear cataract, bilateral: Secondary | ICD-10-CM | POA: Diagnosis not present

## 2022-08-22 DIAGNOSIS — M25531 Pain in right wrist: Secondary | ICD-10-CM | POA: Diagnosis not present

## 2022-08-23 DIAGNOSIS — E119 Type 2 diabetes mellitus without complications: Secondary | ICD-10-CM | POA: Diagnosis not present

## 2022-08-23 DIAGNOSIS — R3989 Other symptoms and signs involving the genitourinary system: Secondary | ICD-10-CM | POA: Diagnosis not present

## 2022-08-23 DIAGNOSIS — R3129 Other microscopic hematuria: Secondary | ICD-10-CM | POA: Diagnosis not present

## 2022-08-23 DIAGNOSIS — Z6830 Body mass index (BMI) 30.0-30.9, adult: Secondary | ICD-10-CM | POA: Diagnosis not present

## 2022-08-30 DIAGNOSIS — N3000 Acute cystitis without hematuria: Secondary | ICD-10-CM | POA: Diagnosis not present

## 2022-08-30 DIAGNOSIS — R8271 Bacteriuria: Secondary | ICD-10-CM | POA: Diagnosis not present

## 2022-08-30 DIAGNOSIS — R310 Gross hematuria: Secondary | ICD-10-CM | POA: Diagnosis not present

## 2022-09-19 DIAGNOSIS — M25531 Pain in right wrist: Secondary | ICD-10-CM | POA: Diagnosis not present

## 2022-10-20 DIAGNOSIS — N3000 Acute cystitis without hematuria: Secondary | ICD-10-CM | POA: Diagnosis not present

## 2022-10-20 DIAGNOSIS — R310 Gross hematuria: Secondary | ICD-10-CM | POA: Diagnosis not present

## 2022-11-21 ENCOUNTER — Other Ambulatory Visit (HOSPITAL_BASED_OUTPATIENT_CLINIC_OR_DEPARTMENT_OTHER): Payer: Self-pay

## 2022-11-21 MED ORDER — COMIRNATY 30 MCG/0.3ML IM SUSY
0.3000 mL | PREFILLED_SYRINGE | Freq: Once | INTRAMUSCULAR | 0 refills | Status: AC
  Filled 2022-11-21: qty 0.3, 1d supply, fill #0

## 2022-11-21 MED ORDER — FLUAD 0.5 ML IM SUSY
PREFILLED_SYRINGE | INTRAMUSCULAR | 0 refills | Status: AC
  Filled 2022-11-21: qty 0.5, 1d supply, fill #0

## 2022-12-13 DIAGNOSIS — E039 Hypothyroidism, unspecified: Secondary | ICD-10-CM | POA: Diagnosis not present

## 2022-12-13 DIAGNOSIS — I10 Essential (primary) hypertension: Secondary | ICD-10-CM | POA: Diagnosis not present

## 2022-12-13 DIAGNOSIS — E1169 Type 2 diabetes mellitus with other specified complication: Secondary | ICD-10-CM | POA: Diagnosis not present

## 2022-12-19 ENCOUNTER — Other Ambulatory Visit: Payer: Self-pay | Admitting: Family Medicine

## 2022-12-19 DIAGNOSIS — E039 Hypothyroidism, unspecified: Secondary | ICD-10-CM | POA: Diagnosis not present

## 2022-12-19 DIAGNOSIS — E119 Type 2 diabetes mellitus without complications: Secondary | ICD-10-CM | POA: Diagnosis not present

## 2022-12-19 DIAGNOSIS — Z0000 Encounter for general adult medical examination without abnormal findings: Secondary | ICD-10-CM

## 2022-12-19 DIAGNOSIS — E1169 Type 2 diabetes mellitus with other specified complication: Secondary | ICD-10-CM | POA: Diagnosis not present

## 2022-12-19 DIAGNOSIS — Z6829 Body mass index (BMI) 29.0-29.9, adult: Secondary | ICD-10-CM | POA: Diagnosis not present

## 2022-12-19 DIAGNOSIS — I10 Essential (primary) hypertension: Secondary | ICD-10-CM | POA: Diagnosis not present

## 2023-02-06 ENCOUNTER — Ambulatory Visit
Admission: RE | Admit: 2023-02-06 | Discharge: 2023-02-06 | Disposition: A | Discharge status: Home / Self Care | Payer: PPO | Source: Ambulatory Visit | Attending: Family Medicine | Admitting: Family Medicine

## 2023-02-06 DIAGNOSIS — Z0000 Encounter for general adult medical examination without abnormal findings: Secondary | ICD-10-CM

## 2023-02-06 DIAGNOSIS — Z1231 Encounter for screening mammogram for malignant neoplasm of breast: Secondary | ICD-10-CM | POA: Diagnosis not present

## 2023-06-20 DIAGNOSIS — I10 Essential (primary) hypertension: Secondary | ICD-10-CM | POA: Diagnosis not present

## 2023-06-20 DIAGNOSIS — E039 Hypothyroidism, unspecified: Secondary | ICD-10-CM | POA: Diagnosis not present

## 2023-06-20 DIAGNOSIS — R7303 Prediabetes: Secondary | ICD-10-CM | POA: Diagnosis not present

## 2023-06-26 DIAGNOSIS — M8588 Other specified disorders of bone density and structure, other site: Secondary | ICD-10-CM | POA: Diagnosis not present

## 2023-06-26 DIAGNOSIS — Z6829 Body mass index (BMI) 29.0-29.9, adult: Secondary | ICD-10-CM | POA: Diagnosis not present

## 2023-06-26 DIAGNOSIS — E782 Mixed hyperlipidemia: Secondary | ICD-10-CM | POA: Diagnosis not present

## 2023-06-26 DIAGNOSIS — I10 Essential (primary) hypertension: Secondary | ICD-10-CM | POA: Diagnosis not present

## 2023-06-26 DIAGNOSIS — Z0000 Encounter for general adult medical examination without abnormal findings: Secondary | ICD-10-CM | POA: Diagnosis not present

## 2023-06-26 DIAGNOSIS — E039 Hypothyroidism, unspecified: Secondary | ICD-10-CM | POA: Diagnosis not present

## 2023-06-26 DIAGNOSIS — E119 Type 2 diabetes mellitus without complications: Secondary | ICD-10-CM | POA: Diagnosis not present

## 2023-07-03 DIAGNOSIS — L814 Other melanin hyperpigmentation: Secondary | ICD-10-CM | POA: Diagnosis not present

## 2023-07-03 DIAGNOSIS — L570 Actinic keratosis: Secondary | ICD-10-CM | POA: Diagnosis not present

## 2023-07-03 DIAGNOSIS — L578 Other skin changes due to chronic exposure to nonionizing radiation: Secondary | ICD-10-CM | POA: Diagnosis not present

## 2023-07-03 DIAGNOSIS — D2271 Melanocytic nevi of right lower limb, including hip: Secondary | ICD-10-CM | POA: Diagnosis not present

## 2023-07-03 DIAGNOSIS — D225 Melanocytic nevi of trunk: Secondary | ICD-10-CM | POA: Diagnosis not present

## 2023-07-03 DIAGNOSIS — L821 Other seborrheic keratosis: Secondary | ICD-10-CM | POA: Diagnosis not present

## 2023-07-03 DIAGNOSIS — Z86018 Personal history of other benign neoplasm: Secondary | ICD-10-CM | POA: Diagnosis not present

## 2023-07-03 DIAGNOSIS — L820 Inflamed seborrheic keratosis: Secondary | ICD-10-CM | POA: Diagnosis not present

## 2023-07-17 ENCOUNTER — Other Ambulatory Visit (HOSPITAL_BASED_OUTPATIENT_CLINIC_OR_DEPARTMENT_OTHER): Payer: Self-pay | Admitting: Family Medicine

## 2023-07-17 DIAGNOSIS — M8580 Other specified disorders of bone density and structure, unspecified site: Secondary | ICD-10-CM

## 2023-08-01 DIAGNOSIS — R350 Frequency of micturition: Secondary | ICD-10-CM | POA: Diagnosis not present

## 2023-08-01 DIAGNOSIS — R319 Hematuria, unspecified: Secondary | ICD-10-CM | POA: Diagnosis not present

## 2023-08-01 DIAGNOSIS — N390 Urinary tract infection, site not specified: Secondary | ICD-10-CM | POA: Diagnosis not present

## 2023-08-20 DIAGNOSIS — H25013 Cortical age-related cataract, bilateral: Secondary | ICD-10-CM | POA: Diagnosis not present

## 2023-08-20 DIAGNOSIS — H2513 Age-related nuclear cataract, bilateral: Secondary | ICD-10-CM | POA: Diagnosis not present

## 2023-08-20 DIAGNOSIS — H524 Presbyopia: Secondary | ICD-10-CM | POA: Diagnosis not present

## 2023-08-20 DIAGNOSIS — H52203 Unspecified astigmatism, bilateral: Secondary | ICD-10-CM | POA: Diagnosis not present

## 2023-08-27 ENCOUNTER — Ambulatory Visit (HOSPITAL_BASED_OUTPATIENT_CLINIC_OR_DEPARTMENT_OTHER)
Admission: RE | Admit: 2023-08-27 | Discharge: 2023-08-27 | Disposition: A | Discharge status: Home / Self Care | Source: Ambulatory Visit | Attending: Family Medicine | Admitting: Family Medicine

## 2023-08-27 DIAGNOSIS — Z780 Asymptomatic menopausal state: Secondary | ICD-10-CM | POA: Insufficient documentation

## 2023-08-27 DIAGNOSIS — M8580 Other specified disorders of bone density and structure, unspecified site: Secondary | ICD-10-CM | POA: Diagnosis not present

## 2023-08-27 DIAGNOSIS — Z13820 Encounter for screening for osteoporosis: Secondary | ICD-10-CM | POA: Insufficient documentation

## 2023-08-27 DIAGNOSIS — M85852 Other specified disorders of bone density and structure, left thigh: Secondary | ICD-10-CM | POA: Diagnosis not present

## 2023-10-23 DIAGNOSIS — H2512 Age-related nuclear cataract, left eye: Secondary | ICD-10-CM | POA: Diagnosis not present

## 2023-10-23 DIAGNOSIS — H25812 Combined forms of age-related cataract, left eye: Secondary | ICD-10-CM | POA: Diagnosis not present

## 2023-10-23 DIAGNOSIS — Z961 Presence of intraocular lens: Secondary | ICD-10-CM | POA: Diagnosis not present

## 2023-10-23 DIAGNOSIS — H25012 Cortical age-related cataract, left eye: Secondary | ICD-10-CM | POA: Diagnosis not present

## 2023-11-06 DIAGNOSIS — H25812 Combined forms of age-related cataract, left eye: Secondary | ICD-10-CM | POA: Diagnosis not present

## 2023-11-06 DIAGNOSIS — H2511 Age-related nuclear cataract, right eye: Secondary | ICD-10-CM | POA: Diagnosis not present

## 2023-11-06 DIAGNOSIS — H25011 Cortical age-related cataract, right eye: Secondary | ICD-10-CM | POA: Diagnosis not present

## 2023-11-06 DIAGNOSIS — H52202 Unspecified astigmatism, left eye: Secondary | ICD-10-CM | POA: Diagnosis not present

## 2023-11-06 DIAGNOSIS — H25811 Combined forms of age-related cataract, right eye: Secondary | ICD-10-CM | POA: Diagnosis not present

## 2023-11-06 DIAGNOSIS — Z961 Presence of intraocular lens: Secondary | ICD-10-CM | POA: Diagnosis not present

## 2023-11-20 ENCOUNTER — Other Ambulatory Visit (HOSPITAL_BASED_OUTPATIENT_CLINIC_OR_DEPARTMENT_OTHER): Payer: Self-pay

## 2023-11-20 MED ORDER — FLUZONE HIGH-DOSE 0.5 ML IM SUSY
0.5000 mL | PREFILLED_SYRINGE | Freq: Once | INTRAMUSCULAR | 0 refills | Status: AC
  Filled 2023-11-20: qty 0.5, 1d supply, fill #0

## 2023-11-20 MED ORDER — COMIRNATY 30 MCG/0.3ML IM SUSY
0.3000 mL | PREFILLED_SYRINGE | Freq: Once | INTRAMUSCULAR | 0 refills | Status: AC
  Filled 2023-11-20: qty 0.3, 1d supply, fill #0

## 2023-12-17 DIAGNOSIS — L719 Rosacea, unspecified: Secondary | ICD-10-CM | POA: Diagnosis not present

## 2023-12-17 DIAGNOSIS — L570 Actinic keratosis: Secondary | ICD-10-CM | POA: Diagnosis not present

## 2023-12-17 DIAGNOSIS — L820 Inflamed seborrheic keratosis: Secondary | ICD-10-CM | POA: Diagnosis not present

## 2024-01-02 DIAGNOSIS — E782 Mixed hyperlipidemia: Secondary | ICD-10-CM | POA: Diagnosis not present

## 2024-01-02 DIAGNOSIS — E119 Type 2 diabetes mellitus without complications: Secondary | ICD-10-CM | POA: Diagnosis not present

## 2024-01-02 DIAGNOSIS — E039 Hypothyroidism, unspecified: Secondary | ICD-10-CM | POA: Diagnosis not present

## 2024-01-02 DIAGNOSIS — I10 Essential (primary) hypertension: Secondary | ICD-10-CM | POA: Diagnosis not present

## 2024-01-07 ENCOUNTER — Other Ambulatory Visit: Payer: Self-pay | Admitting: Family Medicine

## 2024-01-07 DIAGNOSIS — Z1231 Encounter for screening mammogram for malignant neoplasm of breast: Secondary | ICD-10-CM

## 2024-02-11 ENCOUNTER — Inpatient Hospital Stay: Admission: RE | Admit: 2024-02-11 | Discharge: 2024-02-11 | Attending: Family Medicine | Admitting: Family Medicine

## 2024-02-11 DIAGNOSIS — Z1231 Encounter for screening mammogram for malignant neoplasm of breast: Secondary | ICD-10-CM
